# Patient Record
Sex: Female | Born: 1985 | Race: White | Hispanic: No | Marital: Married | Smoking: Never smoker
Health system: Southern US, Community
[De-identification: ages and names within clinical notes are randomized; demographics above are authoritative.]

## PROBLEM LIST (undated history)

## (undated) ENCOUNTER — Inpatient Hospital Stay (HOSPITAL_COMMUNITY): Payer: Self-pay

## (undated) DIAGNOSIS — K219 Gastro-esophageal reflux disease without esophagitis: Secondary | ICD-10-CM

## (undated) DIAGNOSIS — R6252 Short stature (child): Secondary | ICD-10-CM

## (undated) DIAGNOSIS — E079 Disorder of thyroid, unspecified: Secondary | ICD-10-CM

## (undated) DIAGNOSIS — R6250 Unspecified lack of expected normal physiological development in childhood: Secondary | ICD-10-CM

## (undated) DIAGNOSIS — I10 Essential (primary) hypertension: Secondary | ICD-10-CM

## (undated) DIAGNOSIS — R51 Headache: Secondary | ICD-10-CM

## (undated) DIAGNOSIS — R519 Headache, unspecified: Secondary | ICD-10-CM

## (undated) DIAGNOSIS — M549 Dorsalgia, unspecified: Secondary | ICD-10-CM

## (undated) HISTORY — DX: Gastro-esophageal reflux disease without esophagitis: K21.9

## (undated) HISTORY — PX: MOUTH SURGERY: SHX715

## (undated) HISTORY — DX: Disorder of thyroid, unspecified: E07.9

---

## 2014-12-11 ENCOUNTER — Encounter (HOSPITAL_BASED_OUTPATIENT_CLINIC_OR_DEPARTMENT_OTHER): Payer: Self-pay | Admitting: Emergency Medicine

## 2014-12-11 ENCOUNTER — Emergency Department (HOSPITAL_BASED_OUTPATIENT_CLINIC_OR_DEPARTMENT_OTHER)
Admission: EM | Admit: 2014-12-11 | Discharge: 2014-12-11 | Disposition: A | Discharge status: Home / Self Care | Payer: Self-pay | Attending: Emergency Medicine | Admitting: Emergency Medicine

## 2014-12-11 DIAGNOSIS — M542 Cervicalgia: Secondary | ICD-10-CM | POA: Insufficient documentation

## 2014-12-11 DIAGNOSIS — Z3202 Encounter for pregnancy test, result negative: Secondary | ICD-10-CM | POA: Insufficient documentation

## 2014-12-11 DIAGNOSIS — J45909 Unspecified asthma, uncomplicated: Secondary | ICD-10-CM | POA: Insufficient documentation

## 2014-12-11 DIAGNOSIS — Z87828 Personal history of other (healed) physical injury and trauma: Secondary | ICD-10-CM | POA: Insufficient documentation

## 2014-12-11 DIAGNOSIS — Z7951 Long term (current) use of inhaled steroids: Secondary | ICD-10-CM | POA: Insufficient documentation

## 2014-12-11 HISTORY — DX: Dorsalgia, unspecified: M54.9

## 2014-12-11 LAB — PREGNANCY, URINE: PREG TEST UR: NEGATIVE

## 2014-12-11 MED ORDER — TRAMADOL HCL 50 MG PO TABS: 50.0000 mg | ORAL_TABLET | Freq: Four times a day (QID) | ORAL | Status: DC | PRN

## 2014-12-11 MED ORDER — TRAMADOL HCL 50 MG PO TABS
50.0000 mg | ORAL_TABLET | Freq: Once | ORAL | Status: AC
  Administered 2014-12-11: 50 mg via ORAL
  Filled 2014-12-11: qty 1
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
## 2014-12-11 NOTE — Discharge Instructions (Signed)
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
## 2015-05-16 LAB — OB RESULTS CONSOLE ABO/RH: RH Type: POSITIVE

## 2015-05-16 LAB — OB RESULTS CONSOLE HEPATITIS B SURFACE ANTIGEN: Hepatitis B Surface Ag: NEGATIVE

## 2015-05-16 LAB — OB RESULTS CONSOLE ANTIBODY SCREEN: Antibody Screen: NEGATIVE

## 2015-05-16 LAB — OB RESULTS CONSOLE RPR: RPR: NONREACTIVE

## 2015-05-16 LAB — OB RESULTS CONSOLE RUBELLA ANTIBODY, IGM: RUBELLA: IMMUNE

## 2015-05-16 LAB — OB RESULTS CONSOLE HIV ANTIBODY (ROUTINE TESTING): HIV: NONREACTIVE

## 2015-05-23 LAB — OB RESULTS CONSOLE GC/CHLAMYDIA
CHLAMYDIA, DNA PROBE: NEGATIVE
GC PROBE AMP, GENITAL: NEGATIVE

## 2015-07-22 NOTE — L&D Delivery Note (Signed)
Operative Delivery Note At 6:58 PM a viable and healthy female was delivered via Vaginal, Vacuum Investment banker, operational(Extractor).  Presentation: vertex; Position: Right,, Occiput,, Anterior; Station: +4.  Verbal consent: obtained from family.  Risks and benefits discussed in detail.  Risks include, but are not limited to the risks of anesthesia, bleeding, infection, damage to maternal tissues, fetal cephalhematoma.  There is also the risk of inability to effect vaginal delivery of the head, or shoulder dystocia that cannot be resolved by established maneuvers, leading to the need for emergency cesarean section.  APGAR: 6, 8; weight  .   Placenta status: Intact, Spontaneous.   Cord: 3 vessels with the following complications: None.  Cord pH: pending  Anesthesia: Epidural  Instruments: kiwi x 2 pulls , no pop offs Episiotomy: None Lacerations: 2nd degree Suture Repair: 2.0 vicryl rapide Est. Blood Loss (mL): 100  Mom to postpartum.  Baby to Couplet care / Skin to Skin.  Arrayah Connors J 12/11/2015, 7:22 PM

## 2015-11-22 LAB — OB RESULTS CONSOLE GBS: STREP GROUP B AG: NEGATIVE

## 2015-11-28 ENCOUNTER — Other Ambulatory Visit: Payer: Self-pay | Admitting: Obstetrics and Gynecology

## 2015-12-04 ENCOUNTER — Telehealth (HOSPITAL_COMMUNITY): Payer: Self-pay | Admitting: *Deleted

## 2015-12-04 ENCOUNTER — Encounter (HOSPITAL_COMMUNITY): Payer: Self-pay | Admitting: *Deleted

## 2015-12-04 NOTE — Telephone Encounter (Signed)
Preadmission screen  

## 2015-12-11 ENCOUNTER — Other Ambulatory Visit: Payer: Self-pay | Admitting: Obstetrics and Gynecology

## 2015-12-11 ENCOUNTER — Inpatient Hospital Stay (HOSPITAL_COMMUNITY): Payer: Managed Care, Other (non HMO) | Admitting: Anesthesiology

## 2015-12-11 ENCOUNTER — Encounter (HOSPITAL_COMMUNITY): Payer: Self-pay

## 2015-12-11 ENCOUNTER — Inpatient Hospital Stay (HOSPITAL_COMMUNITY)
Admission: RE | Admit: 2015-12-11 | Discharge: 2015-12-13 | DRG: 775 | Disposition: A | Discharge status: Home / Self Care | Payer: Managed Care, Other (non HMO) | Source: Ambulatory Visit | Attending: Obstetrics and Gynecology | Admitting: Obstetrics and Gynecology

## 2015-12-11 DIAGNOSIS — O365930 Maternal care for other known or suspected poor fetal growth, third trimester, not applicable or unspecified: Principal | ICD-10-CM | POA: Diagnosis present

## 2015-12-11 DIAGNOSIS — Z6841 Body Mass Index (BMI) 40.0 and over, adult: Secondary | ICD-10-CM | POA: Diagnosis not present

## 2015-12-11 DIAGNOSIS — O4420 Partial placenta previa NOS or without hemorrhage, unspecified trimester: Secondary | ICD-10-CM

## 2015-12-11 DIAGNOSIS — Z8759 Personal history of other complications of pregnancy, childbirth and the puerperium: Secondary | ICD-10-CM

## 2015-12-11 DIAGNOSIS — O365990 Maternal care for other known or suspected poor fetal growth, unspecified trimester, not applicable or unspecified: Secondary | ICD-10-CM | POA: Diagnosis present

## 2015-12-11 DIAGNOSIS — Z3A39 39 weeks gestation of pregnancy: Secondary | ICD-10-CM

## 2015-12-11 DIAGNOSIS — O99214 Obesity complicating childbirth: Secondary | ICD-10-CM | POA: Diagnosis present

## 2015-12-11 DIAGNOSIS — IMO0002 Reserved for concepts with insufficient information to code with codable children: Secondary | ICD-10-CM | POA: Diagnosis present

## 2015-12-11 LAB — CBC
HCT: 41.1 % (ref 36.0–46.0)
Hemoglobin: 13.6 g/dL (ref 12.0–15.0)
MCH: 28.5 pg (ref 26.0–34.0)
MCHC: 33.1 g/dL (ref 30.0–36.0)
MCV: 86.2 fL (ref 78.0–100.0)
PLATELETS: 229 10*3/uL (ref 150–400)
RBC: 4.77 MIL/uL (ref 3.87–5.11)
RDW: 15.7 % — AB (ref 11.5–15.5)
WBC: 11.2 10*3/uL — AB (ref 4.0–10.5)

## 2015-12-11 LAB — TYPE AND SCREEN
ABO/RH(D): O POS
Antibody Screen: NEGATIVE

## 2015-12-11 LAB — COMPREHENSIVE METABOLIC PANEL
ALT: 15 U/L (ref 14–54)
AST: 22 U/L (ref 15–41)
Albumin: 2.9 g/dL — ABNORMAL LOW (ref 3.5–5.0)
Alkaline Phosphatase: 143 U/L — ABNORMAL HIGH (ref 38–126)
Anion gap: 13 (ref 5–15)
BUN: 13 mg/dL (ref 6–20)
CHLORIDE: 103 mmol/L (ref 101–111)
CO2: 19 mmol/L — ABNORMAL LOW (ref 22–32)
Calcium: 8.8 mg/dL — ABNORMAL LOW (ref 8.9–10.3)
Creatinine, Ser: 0.67 mg/dL (ref 0.44–1.00)
Glucose, Bld: 120 mg/dL — ABNORMAL HIGH (ref 65–99)
POTASSIUM: 4.1 mmol/L (ref 3.5–5.1)
Sodium: 135 mmol/L (ref 135–145)
Total Bilirubin: 0.4 mg/dL (ref 0.3–1.2)
Total Protein: 6.3 g/dL — ABNORMAL LOW (ref 6.5–8.1)

## 2015-12-11 LAB — RPR: RPR: NONREACTIVE

## 2015-12-11 LAB — ABO/RH: ABO/RH(D): O POS

## 2015-12-11 MED ORDER — LACTATED RINGERS IV SOLN
INTRAVENOUS | Status: DC
  Administered 2015-12-11: 125 mL/h via INTRAUTERINE

## 2015-12-11 MED ORDER — BENZOCAINE-MENTHOL 20-0.5 % EX AERO
1.0000 "application " | INHALATION_SPRAY | CUTANEOUS | Status: DC | PRN
  Administered 2015-12-11: 1 via TOPICAL
  Filled 2015-12-11: qty 56

## 2015-12-11 MED ORDER — METHYLERGONOVINE MALEATE 0.2 MG/ML IJ SOLN: 0.2000 mg | INTRAMUSCULAR | Status: DC | PRN

## 2015-12-11 MED ORDER — OXYCODONE-ACETAMINOPHEN 5-325 MG PO TABS: 2.0000 | ORAL_TABLET | ORAL | Status: DC | PRN

## 2015-12-11 MED ORDER — FLEET ENEMA 7-19 GM/118ML RE ENEM: 1.0000 | ENEMA | RECTAL | Status: DC | PRN

## 2015-12-11 MED ORDER — ACETAMINOPHEN 325 MG PO TABS: 650.0000 mg | ORAL_TABLET | ORAL | Status: DC | PRN

## 2015-12-11 MED ORDER — ZOLPIDEM TARTRATE 5 MG PO TABS: 5.0000 mg | ORAL_TABLET | Freq: Every evening | ORAL | Status: DC | PRN

## 2015-12-11 MED ORDER — PRENATAL MULTIVITAMIN CH
1.0000 | ORAL_TABLET | Freq: Every day | ORAL | Status: DC
  Administered 2015-12-12 – 2015-12-13 (×2): 1 via ORAL
  Filled 2015-12-11 (×2): qty 1

## 2015-12-11 MED ORDER — METHYLERGONOVINE MALEATE 0.2 MG PO TABS: 0.2000 mg | ORAL_TABLET | ORAL | Status: DC | PRN

## 2015-12-11 MED ORDER — DIBUCAINE 1 % RE OINT: 1.0000 "application " | TOPICAL_OINTMENT | RECTAL | Status: DC | PRN

## 2015-12-11 MED ORDER — TETANUS-DIPHTH-ACELL PERTUSSIS 5-2.5-18.5 LF-MCG/0.5 IM SUSP: 0.5000 mL | Freq: Once | INTRAMUSCULAR | Status: DC

## 2015-12-11 MED ORDER — SIMETHICONE 80 MG PO CHEW: 80.0000 mg | CHEWABLE_TABLET | ORAL | Status: DC | PRN

## 2015-12-11 MED ORDER — FENTANYL CITRATE (PF) 100 MCG/2ML IJ SOLN: 50.0000 ug | INTRAMUSCULAR | Status: DC | PRN

## 2015-12-11 MED ORDER — OXYTOCIN BOLUS FROM INFUSION
500.0000 mL | INTRAVENOUS | Status: DC
  Administered 2015-12-11: 500 mL via INTRAVENOUS

## 2015-12-11 MED ORDER — SENNOSIDES-DOCUSATE SODIUM 8.6-50 MG PO TABS
2.0000 | ORAL_TABLET | ORAL | Status: DC
  Administered 2015-12-12 (×2): 2 via ORAL
  Filled 2015-12-11 (×2): qty 2

## 2015-12-11 MED ORDER — ONDANSETRON HCL 4 MG PO TABS: 4.0000 mg | ORAL_TABLET | ORAL | Status: DC | PRN

## 2015-12-11 MED ORDER — EPHEDRINE 5 MG/ML INJ
10.0000 mg | INTRAVENOUS | Status: DC | PRN
  Filled 2015-12-11: qty 2

## 2015-12-11 MED ORDER — LIDOCAINE HCL (PF) 1 % IJ SOLN
INTRAMUSCULAR | Status: DC | PRN
  Administered 2015-12-11: 8 mL via EPIDURAL

## 2015-12-11 MED ORDER — TERBUTALINE SULFATE 1 MG/ML IJ SOLN
0.2500 mg | Freq: Once | INTRAMUSCULAR | Status: DC | PRN
  Filled 2015-12-11: qty 1

## 2015-12-11 MED ORDER — OXYCODONE-ACETAMINOPHEN 5-325 MG PO TABS: 1.0000 | ORAL_TABLET | ORAL | Status: DC | PRN

## 2015-12-11 MED ORDER — ONDANSETRON HCL 4 MG/2ML IJ SOLN: 4.0000 mg | INTRAMUSCULAR | Status: DC | PRN

## 2015-12-11 MED ORDER — LACTATED RINGERS IV SOLN: 500.0000 mL | Freq: Once | INTRAVENOUS | Status: DC

## 2015-12-11 MED ORDER — FENTANYL 2.5 MCG/ML BUPIVACAINE 1/10 % EPIDURAL INFUSION (WH - ANES)
14.0000 mL/h | INTRAMUSCULAR | Status: DC | PRN
  Administered 2015-12-11 (×2): 14 mL/h via EPIDURAL
  Filled 2015-12-11 (×2): qty 125

## 2015-12-11 MED ORDER — LACTATED RINGERS IV SOLN
INTRAVENOUS | Status: DC
  Administered 2015-12-11: 125 mL/h via INTRAVENOUS
  Administered 2015-12-11: 500 mL via INTRAVENOUS
  Administered 2015-12-11 (×2): 125 mL/h via INTRAVENOUS

## 2015-12-11 MED ORDER — CITRIC ACID-SODIUM CITRATE 334-500 MG/5ML PO SOLN: 30.0000 mL | ORAL | Status: DC | PRN

## 2015-12-11 MED ORDER — PHENYLEPHRINE 40 MCG/ML (10ML) SYRINGE FOR IV PUSH (FOR BLOOD PRESSURE SUPPORT)
80.0000 ug | PREFILLED_SYRINGE | INTRAVENOUS | Status: DC | PRN
  Filled 2015-12-11: qty 5

## 2015-12-11 MED ORDER — PHENYLEPHRINE 40 MCG/ML (10ML) SYRINGE FOR IV PUSH (FOR BLOOD PRESSURE SUPPORT)
80.0000 ug | PREFILLED_SYRINGE | INTRAVENOUS | Status: DC | PRN
  Filled 2015-12-11: qty 5
  Filled 2015-12-11: qty 10

## 2015-12-11 MED ORDER — OXYTOCIN 40 UNITS IN LACTATED RINGERS INFUSION - SIMPLE MED
1.0000 m[IU]/min | INTRAVENOUS | Status: DC
  Administered 2015-12-11: 2 m[IU]/min via INTRAVENOUS

## 2015-12-11 MED ORDER — DIPHENHYDRAMINE HCL 50 MG/ML IJ SOLN: 12.5000 mg | INTRAMUSCULAR | Status: DC | PRN

## 2015-12-11 MED ORDER — LIDOCAINE HCL (PF) 1 % IJ SOLN
30.0000 mL | INTRAMUSCULAR | Status: DC | PRN
  Filled 2015-12-11: qty 30

## 2015-12-11 MED ORDER — VITAMIN K1 1 MG/0.5ML IJ SOLN
INTRAMUSCULAR | Status: AC
  Filled 2015-12-11: qty 0.5

## 2015-12-11 MED ORDER — MISOPROSTOL 25 MCG QUARTER TABLET
25.0000 ug | ORAL_TABLET | ORAL | Status: DC | PRN
  Filled 2015-12-11: qty 1

## 2015-12-11 MED ORDER — WITCH HAZEL-GLYCERIN EX PADS: 1.0000 "application " | MEDICATED_PAD | CUTANEOUS | Status: DC | PRN

## 2015-12-11 MED ORDER — LACTATED RINGERS IV SOLN
500.0000 mL | INTRAVENOUS | Status: DC | PRN
  Administered 2015-12-11 (×2): 500 mL via INTRAVENOUS

## 2015-12-11 MED ORDER — ACETAMINOPHEN 325 MG PO TABS
650.0000 mg | ORAL_TABLET | ORAL | Status: DC | PRN
  Administered 2015-12-12 – 2015-12-13 (×2): 650 mg via ORAL
  Filled 2015-12-11 (×2): qty 2

## 2015-12-11 MED ORDER — OXYTOCIN 40 UNITS IN LACTATED RINGERS INFUSION - SIMPLE MED
2.5000 [IU]/h | INTRAVENOUS | Status: DC
  Filled 2015-12-11: qty 1000

## 2015-12-11 MED ORDER — ONDANSETRON HCL 4 MG/2ML IJ SOLN
4.0000 mg | Freq: Four times a day (QID) | INTRAMUSCULAR | Status: DC | PRN
  Administered 2015-12-11: 4 mg via INTRAVENOUS
  Filled 2015-12-11: qty 2

## 2015-12-11 MED ORDER — IBUPROFEN 600 MG PO TABS
600.0000 mg | ORAL_TABLET | Freq: Four times a day (QID) | ORAL | Status: DC
  Administered 2015-12-12 – 2015-12-13 (×7): 600 mg via ORAL
  Filled 2015-12-11 (×7): qty 1

## 2015-12-11 MED ORDER — DIPHENHYDRAMINE HCL 25 MG PO CAPS: 25.0000 mg | ORAL_CAPSULE | Freq: Four times a day (QID) | ORAL | Status: DC | PRN

## 2015-12-11 MED ORDER — COCONUT OIL OIL: 1.0000 "application " | TOPICAL_OIL | Status: DC | PRN

## 2015-12-11 NOTE — Progress Notes (Addendum)
Pt care given to Orene Desanctishi-Chi O. RN [facility] given to [place], [place]

## 2015-12-11 NOTE — Progress Notes (Signed)
Jessica Hooper is a 30 y.o. G1P0 at 2476w6d by LMP admitted for induction of labor due to Poor fetal growth.  Subjective: Comfortable after epidural  Objective: BP 135/95 mmHg  Pulse 83  Temp(Src) 98.3 F (36.8 C) (Oral)  Resp 16  Ht 5\' 1"  (1.549 m)  Wt 102.513 kg (226 lb)  BMI 42.72 kg/m2  SpO2 100%  LMP 03/15/2015      FHT:  FHR: 155 bpm, variability: moderate,  accelerations:  Present,  decelerations:  Absent UC:   regular, every 2-3 minutes SVE:   Dilation: 4.5 Effacement (%): 80 Station: -1, 0 Exam by:: MD Fredrika Canby  AROM- clear FSE and IUPC placed without difficulty  Labs: Lab Results  Component Value Date   WBC 11.2* 12/11/2015   HGB 13.6 12/11/2015   HCT 41.1 12/11/2015   MCV 86.2 12/11/2015   PLT 229 12/11/2015    Assessment / Plan: Induction of labor due to IUGR,  progressing well on pitocin  Labor: Progressing normally Preeclampsia:  no signs or symptoms of toxicity Fetal Wellbeing:  Category I Pain Control:  Epidural I/D:  n/a Anticipated MOD:  NSVD  Jessica Hooper 12/11/2015, 12:30 PM

## 2015-12-11 NOTE — Anesthesia Preprocedure Evaluation (Signed)
Anesthesia Evaluation  Patient identified by MRN, date of birth, ID band Patient awake    Reviewed: Allergy & Precautions, H&P , NPO status , Patient's Chart, lab work & pertinent test results  Airway Mallampati: II  TM Distance: >3 FB Neck ROM: full    Dental no notable dental hx.    Pulmonary    Pulmonary exam normal        Cardiovascular negative cardio ROS Normal cardiovascular exam     Neuro/Psych negative neurological ROS  negative psych ROS   GI/Hepatic Neg liver ROS,   Endo/Other  Morbid obesity  Renal/GU negative Renal ROS     Musculoskeletal   Abdominal (+) + obese,   Peds  Hematology negative hematology ROS (+)   Anesthesia Other Findings   Reproductive/Obstetrics (+) Pregnancy                             Anesthesia Physical Anesthesia Plan  ASA: III  Anesthesia Plan: Epidural   Post-op Pain Management:    Induction:   Airway Management Planned:   Additional Equipment:   Intra-op Plan:   Post-operative Plan:   Informed Consent: I have reviewed the patients History and Physical, chart, labs and discussed the procedure including the risks, benefits and alternatives for the proposed anesthesia with the patient or authorized representative who has indicated his/her understanding and acceptance.     Plan Discussed with:   Anesthesia Plan Comments:         Anesthesia Quick Evaluation

## 2015-12-11 NOTE — Progress Notes (Signed)
Jessica Jessica is a 30 y.o. G1P0 at 2544w6d by LMP admitted for induction of labor due to Poor fetal growth.  Subjective: Comfortable  Objective: BP 147/89 mmHg  Pulse 72  Temp(Src) 98.1 F (36.7 C) (Oral)  Resp 16  Ht 5\' 1"  (1.549 m)  Wt 102.513 kg (226 lb)  BMI 42.72 kg/m2  SpO2 100%  LMP 03/15/2015   Total I/O In: -  Out: 750 [Urine:750]  FHT:  FHR: 130s bpm, variability: moderate,  accelerations:  Present,  decelerations:  Present occ variable , occ late, non repetitive UC:   irregular, every 1-4 minutes SVE:   Dilation: 6 Effacement (%): 90 Station: -1 Exam by:: MD Billy Coastaavon /OT MVU 200-300 Cervix non edematous  Labs: Lab Results  Component Value Date   WBC 11.2* 12/11/2015   HGB 13.6 12/11/2015   HCT 41.1 12/11/2015   MCV 86.2 12/11/2015   PLT 229 12/11/2015    Assessment / Plan: Induction of labor due to IUGR,  progressing well on pitocin Occ Category 2 tracing, good scalp stimulation  Labor: Minimal process, OT  Preeclampsia:  no signs or symptoms of toxicity Fetal Wellbeing:  Category I and Category II Pain Control:  Epidural I/D:  n/a Anticipated MOD:  guarded  Repositon exaggerated sims, Amnioinfuse if variables recurrent Reassess one hr  Jessica Jessica 12/11/2015, 5:05 PM

## 2015-12-11 NOTE — Anesthesia Procedure Notes (Signed)
Epidural Patient location during procedure: OB Start time: 12/11/2015 12:07 PM End time: 12/11/2015 12:11 PM  Staffing Anesthesiologist: Leilani AbleHATCHETT, Issaih Kaus  Preanesthetic Checklist Completed: patient identified, surgical consent, pre-op evaluation, timeout performed, IV checked, risks and benefits discussed and monitors and equipment checked  Epidural Patient position: sitting Prep: site prepped and draped and DuraPrep Patient monitoring: continuous pulse ox and blood pressure Approach: midline Location: L3-L4 Injection technique: LOR air  Needle:  Needle type: Tuohy  Needle gauge: 17 G Needle length: 9 cm and 9 Needle insertion depth: 6 cm Catheter type: closed end flexible Catheter size: 19 Gauge Catheter at skin depth: 11 cm Test dose: negative and Other  Assessment Sensory level: T9 Events: blood not aspirated, injection not painful, no injection resistance, negative IV test and no paresthesia  Additional Notes Reason for block:procedure for pain

## 2015-12-11 NOTE — Progress Notes (Signed)
Jessica Hooper is a 30 y.o. G1P0 at 817w6d by LMP admitted for induction of labor due to Poor fetal growth.  Subjective: Occ pressure  Objective: BP 139/79 mmHg  Pulse 70  Temp(Src) 98.1 F (36.7 C) (Oral)  Resp 16  Ht 5\' 1"  (1.549 m)  Wt 102.513 kg (226 lb)  BMI 42.72 kg/m2  SpO2 100%  LMP 03/15/2015      FHT:  FHR: 150 bpm, variability: moderate,  accelerations:  Present,  decelerations:  Present non repetitive variables and occ late UC:   regular, every 2-3 minutes SVE:   Dilation: 5.5 Effacement (%): 80, 90 Station: -1, 0 Exam by:: MD Orton Capell MVU > 200  Labs: Lab Results  Component Value Date   WBC 11.2* 12/11/2015   HGB 13.6 12/11/2015   HCT 41.1 12/11/2015   MCV 86.2 12/11/2015   PLT 229 12/11/2015    Assessment / Plan: Induction of labor due to IUGR,  progressing well on pitocin  Labor: Progressing normally Preeclampsia:  no signs or symptoms of toxicity Fetal Wellbeing:  Category I Pain Control:  Epidural I/D:  n/a Anticipated MOD:  Guarded approach to VD  Khup Sapia J 12/11/2015, 2:24 PM

## 2015-12-11 NOTE — Anesthesia Pain Management Evaluation Note (Signed)
  CRNA Pain Management Visit Note  Patient: Jessica Hooper, 30 y.o., female  "Hello [facility] am a [facility]. We have an anesthesia team available at all times to provide care throughout the hospital, including epidural management and anesthesia for C-section. I don't know your plan for the delivery whether it a natural birth, water birth, IV sedation, nitrous supplementation, doula or epidural, but we want to meet your pain goals."   1.Was your pain managed to your expectations on prior hospitalizations?   No prior hospitalizations  2.What is your expectation for pain management during this hospitalization?     Epidural  3.How can we help you reach that goal? Epidural  Record the patient's initial score and the patient's pain goal.   Pain: 0  Pain Goal: 6 [facility] wants you to be able to say your pain was always managed very well.  Cephus ShellingBURGER,Bensen Chadderdon 12/11/2015
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
## 2015-12-12 ENCOUNTER — Inpatient Hospital Stay (HOSPITAL_COMMUNITY): Admission: RE | Admit: 2015-12-12 | Payer: No Typology Code available for payment source | Source: Ambulatory Visit

## 2015-12-12 ENCOUNTER — Encounter (HOSPITAL_COMMUNITY): Payer: Self-pay

## 2015-12-12 DIAGNOSIS — Z8759 Personal history of other complications of pregnancy, childbirth and the puerperium: Secondary | ICD-10-CM

## 2015-12-12 LAB — CBC
HEMATOCRIT: 35.6 % — AB (ref 36.0–46.0)
Hemoglobin: 11.8 g/dL — ABNORMAL LOW (ref 12.0–15.0)
MCH: 28.5 pg (ref 26.0–34.0)
MCHC: 33.1 g/dL (ref 30.0–36.0)
MCV: 86 fL (ref 78.0–100.0)
PLATELETS: 207 10*3/uL (ref 150–400)
RBC: 4.14 MIL/uL (ref 3.87–5.11)
RDW: 15.9 % — AB (ref 11.5–15.5)
WBC: 14.8 10*3/uL — AB (ref 4.0–10.5)

## 2015-12-12 MED ORDER — OXYCODONE-ACETAMINOPHEN 5-325 MG PO TABS
1.0000 | ORAL_TABLET | ORAL | Status: DC | PRN
  Administered 2015-12-12 (×2): 1 via ORAL
  Administered 2015-12-12: 2 via ORAL
  Administered 2015-12-13: 1 via ORAL
  Filled 2015-12-12 (×2): qty 1
  Filled 2015-12-12: qty 2

## 2015-12-12 NOTE — Lactation Note (Signed)
This note was copied from a baby's chart. Lactation Consultation Note  Mom just finished giving baby bottle of formula. Encouraged to pump now to promote milk supply. Encouraged to page for assist at next feeding. No questions at present.  Patient Name: Jessica Hooper VHQIO'NToday's Date: 12/12/2015     Maternal Data    Feeding    LATCH Score/Interventions                      Lactation Tools Discussed/Used     Consult Status      Pamelia HoitWeeks, [name] 12/12/2015, 10:49 AM

## 2015-12-12 NOTE — Progress Notes (Signed)
Patient ID: Jessica Hooper, female   DOB: 1985-10-28, 30 y.o.   MRN: [id number] PPD # 1  Subjective: Pt reports feeling "ok" / Pain controlled with ibuprofen Tolerating po/ Voiding without problems/ No n/v Bleeding is moderate Newborn info:  Information for the patient's newborn:  [place], Boy Marchelle Folksmanda [[id number]][[id number]]  female  / circ to be performed today by Dr Billy Coastaavon / Feeding: breast   Objective:  VS: Blood pressure 129/88, pulse 79, temperature 97.8 F (36.6 C), temperature source Oral, resp. rate 18.    Recent Labs  12/11/15 0750 12/12/15 0505  WBC 11.2* 14.8*  HGB 13.6 11.8*  HCT 41.1 35.6*  PLT 229 207    Blood type: O POS Rubella: Immune    Physical Exam:  General: alert, cooperative and no distress CV: Regular rate and rhythm Resp: clear Abdomen: soft, nontender, normal bowel sounds Uterine Fundus: firm, below umbilicus, nontender Perineum: healing with good reapproximation Lochia: moderate Ext: edema trace and Homans sign is negative, no sign of DVT   A/P: PPD # 1/ G1P1001/ S/P: VAVD w/ 2nd deg lac Doing well Continue routine post partum orders Anticipate D/C home in AM    Demetrius RevelFISHER,Eiza Canniff K, MSN, [facility] 12/12/2015, 10:14 AM

## 2015-12-12 NOTE — Lactation Note (Signed)
This note was copied from a baby's chart. Lactation Consultation Note New mom has flat nipples on large breast. Hand expression w/a dot of thick colostrum. Hand expression taught to Mom. RN gave NS for baby to latch on. Mom has shells on and wears between feedings provided by RN. Mom stated she was able to latch the baby for the last feeding after wearing the shells. Mom denied painful latch. When compressing the areola, the nipple compresses in and areola dimples in as if edema. Reverse pressure helpful. Rolled nipple and was able to pull out some. LC not sure if baby could obtain deep latch for long. RN set up DEBP w/teaching. LPI information sheet given for feeding information. Mom encouraged to feed baby 8-12 times/24 hours and with feeding cues. educated about newborn behavior.Ecouraged to call for assistance if needed and to verify proper latch.Referred to Baby and Me Book in Breastfeeding section Pg. 22-23 for position options and Proper latch demonstration.WH/LC brochure given w/resources, support groups and LC services. Encouraged STS, I&O, supply and demand. Discussed supplementation after BF. Mom ok w/giving formula for supplementing.  Patient Name: Jessica Hooper'UToday's Date: 12/12/2015 Reason for consult: Initial assessment;Difficult latch;Infant < 6lbs   Maternal Data Has patient been taught Hand Expression?: Yes Does the patient have breastfeeding experience prior to this delivery?: No  Feeding Feeding Type: Formula  LATCH Score/Interventions Intervention(s): Skin to skin;Teach feeding cues;Waking techniques  Intervention(s): Hand expression  Type of Nipple: Flat Intervention(s): Shells;Double electric pump  Comfort (Breast/Nipple): Soft / non-tender     Intervention(s): Breastfeeding basics reviewed;Support Pillows;Position options;Skin to skin     Lactation Tools Discussed/Used Pump Review: Setup, frequency, and cleaning;Milk Storage Initiated by:: RN Date  initiated:: 12/12/15   Consult Status Consult Status: Follow-up Date: 12/12/15 Follow-up type: In-patient    Charyl DancerCARVER, Jaiden Wahab G 12/12/2015, 4:41 AM

## 2015-12-12 NOTE — Anesthesia Postprocedure Evaluation (Signed)
Anesthesia Post Note  Patient: Jessica Hooper  Procedure(s) Performed: * No procedures listed *  Patient location during evaluation: Mother Baby Anesthesia Type: Epidural Level of consciousness: awake and alert Pain management: pain level controlled Vital Signs Assessment: post-procedure vital signs reviewed and stable Respiratory status: spontaneous breathing, nonlabored ventilation and respiratory function stable Cardiovascular status: stable Postop Assessment: no headache, no backache and epidural receding Anesthetic complications: no     Last Vitals:  Filed Vitals:   12/12/15 0259 12/12/15 0604  BP: 126/80 129/88  Pulse: 90 79  Temp:  36.6 C  Resp:  18    Last Pain:  Filed Vitals:   12/12/15 0638  PainSc: 1    Pain Goal:                 [facility] EnglandMARSHALL,Lexxi Koslow

## 2015-12-13 ENCOUNTER — Encounter (HOSPITAL_COMMUNITY): Payer: Self-pay

## 2015-12-13 MED ORDER — OXYCODONE-ACETAMINOPHEN 5-325 MG PO TABS: 1.0000 | ORAL_TABLET | ORAL | Status: DC | PRN

## 2015-12-13 NOTE — Lactation Note (Addendum)
This note was copied from a baby's chart. Lactation Consultation Note: Mother states that infant is not latching well at this time. She states that she is pumping after attempting to breastfeed. Mother is supplementing with formula.  Reviewed supplemental guidelines. Mother has a hard copy of guidelines.  Reviewed treatment to prevent severe engorgement. High lighted information in baby and me book. Mother was offered a follow up visit with Lactation consultant. Mother reluctant and declined visit. She state," I will wait until I  gets home and see how things go". Mother is aware of BFSG's and community services to assist with breastfeeding.   Patient Name: [name] ZOXWR'UToday's Date: 12/13/2015 Reason for consult: Follow-up assessment   Maternal Data    Feeding Feeding Type: Breast Fed Length of feed: 12 min  LATCH Score/Interventions                      Lactation Tools Discussed/Used     Consult Status      Jessica Hooper, Jessica Hooper 12/13/2015, 10:59 AM

## 2015-12-13 NOTE — Progress Notes (Signed)
Patient ID: Jessica Hooper, female   DOB: 1985/11/25, 30 y.o.   MRN: [id number] PPD # 2  Subjective: Pt reports feeling sore, and eager for d/c home / Pain controlled with ibuprofen and percocet Tolerating po/ Voiding without problems/ No n/v Bleeding is moderate/ Newborn info:  Information for the patient's newborn:  [place], Boy Marchelle Folksmanda [[id number]][[id number]]  female  / circ performed yesterday / Feeding: breast    Objective:  VS: Blood pressure 142/79, pulse 87, temperature 98 F (36.7 C), temperature source Oral, resp. rate 87.    Recent Labs  12/11/15 0750 12/12/15 0505  WBC 11.2* 14.8*  HGB 13.6 11.8*  HCT 41.1 35.6*  PLT 229 207    Blood type: O POS Rubella: Immune    Physical Exam:  General:  alert, cooperative and no distress CV: Regular rate and rhythm Resp: clear Abdomen: soft, nontender, normal bowel sounds Uterine Fundus: firm, below umbilicus, nontender Perineum: healing with good reapproximation and mod ecchymosis Lochia: moderate Ext: edema +1 pedal and pretib and Homans sign is negative, no sign of DVT    A/P: PPD # 2/ G1P1001/ S/P:VAVD w/ 2nd deg lac Doing well and stable for discharge home RX: Percocet 5/325 1 to 2 po Q 4 hrs prn pain #12 No refill WOB/GYN booklet given Routine pp visit in 6wks   Demetrius RevelFISHER,Daved Mcfann K, MSN, [facility] 12/13/2015, 8:25 AM

## 2015-12-13 NOTE — Discharge Summary (Signed)
OB Discharge Summary  Patient Name: Jessica Hooper DOB: 11/14/1985 MRN: [id number]  Date of admission: 12/11/2015  Pt is a G1P1001 at 3065w6d. Admitting diagnosis: INDUCTION Secondary diagnosis: IUGR   Discharge diagnosis: Term Pregnancy Delivered and VAVD w/ 2nd deg lac   Date of discharge: 12/13/2015        Prenatal history: G1P1001   EDC : 12/19/2015, by Other Basis  [facility] at [facility]  Primary provider : Dr Jessica Hooper Prenatal course complicated by Obesity and IUGR  Prenatal Labs: ABO, Rh: --/--/O POS, O POS (05/23 0750) Antibody: NEG (05/23 0750) Rubella: Immune (10/26 0000) RPR: Non Reactive (05/23 0750)  HBsAg: Negative (10/26 0000)  HIV: Non-reactive (10/26 0000)  GBS: Negative (05/04 0000)                                    Hospital course:  Induction of Labor With Vaginal Delivery   30 y.o. yo G1P1001 at 4665w6d was admitted to the hospital 12/11/2015 for induction of labor.  Indication for induction: IUGR.  Patient had an uncomplicated labor course as follows: Membrane Rupture Time/Date: 12:24 PM ,12/11/2015   Intrapartum Procedures: Episiotomy: None [1]                                         Lacerations:  2nd degree [3]  Patient had delivery of a Viable infant.  Information for the patient's newborn:  Jessica Hooper, [name] [[id number]][[id number]]  Delivery Method: Vaginal, Vacuum (Extractor) (Filed from Delivery Summary)   12/11/2015  Details of delivery can be found in separate delivery note.  Patient had a routine postpartum course. Patient is discharged home 12/13/2015.  Augmentation: Pitocin Delivering PROVIDER: Olivia MackieAAVON, Hooper                                                            Complications: None  Newborn Data: Live born female  Birth Weight: 5 lb 2.9 oz (2350 g) APGAR: 6, 8  Baby Feeding: Breast Disposition:home with mother  Post partum procedures:none    Labs: Lab Results  Component Value Date   WBC 14.8* 12/12/2015   HGB 11.8* 12/12/2015   HCT 35.6* 12/12/2015   MCV 86.0 12/12/2015   PLT 207 12/12/2015     Physical Exam @ time of discharge:  Filed Vitals:   12/12/15 0259 12/12/15 0604 12/12/15 1807 12/13/15 0617  BP: 126/80 129/88 123/81 142/79  Pulse: 90 79 93 87  Temp:  97.8 F (36.6 C) 98 F (36.7 C) 98 F (36.7 C)  TempSrc:  Oral Oral Oral  Resp:  18 18 87  Height:      Weight:      SpO2:        General: alert, cooperative and no distress Lochia: appropriate Uterine Fundus: firm Perineum: mod ecchymosis, approximating well, mild edema Incision: N/A Extremities: No evidence of DVT seen on physical exam. +1 pedal and pretib edema   CMP Latest Ref Rng 12/11/2015  Glucose 65 - 99 mg/dL 629(B120(H)  BUN 6 - 20 mg/dL 13  Creatinine 2.840.44 - 1.321.00 mg/dL 4.400.67  Sodium 135 - 145 mmol/L 135  Potassium 3.5 - 5.1 mmol/L 4.1  Chloride 101 - 111 mmol/L 103  CO2 22 - 32 mmol/L 19(L)  Calcium 8.9 - 10.3 mg/dL 1.6(X)  Total Protein 6.5 - 8.1 g/dL 6.3(L)  Total Bilirubin 0.3 - 1.2 mg/dL 0.4  Alkaline Phos 38 - 126 U/L 143(H)  AST 15 - 41 U/L 22  ALT 14 - 54 U/L 15   Discharge Medications:    Medication List    TAKE these medications        acetaminophen 500 MG tablet  Commonly known as:  TYLENOL  Take 1,000 mg by mouth every 6 (six) hours as needed for moderate pain.     acetaminophen 325 MG tablet  Commonly known as:  TYLENOL  Take 650 mg by mouth every 6 (six) hours as needed for moderate pain.     bisacodyl 5 MG EC tablet  Generic drug:  bisacodyl  Take 10 mg by mouth daily as needed for moderate constipation.     IMODIUM A-D 2 MG capsule  Generic drug:  loperamide  Take 4 mg by mouth as needed for diarrhea or loose stools.     mometasone-formoterol 100-5 MCG/ACT Aero  Commonly known as:  DULERA  Inhale 2 puffs into the lungs 2 (two) times daily.     oxyCODONE-acetaminophen 5-325 MG tablet  Commonly known as:  PERCOCET/ROXICET  Take 1-2 tablets by mouth every 4 (four) hours  as needed for moderate pain (pain).     prenatal multivitamin Tabs tablet  Take 1 tablet by mouth daily at 12 noon.         Discharge instructions:  "Baby and Me Booklet" and [facility] Book[facility]  Diet: routine diet  Activity: Advance as tolerated. Pelvic rest x 6 weeks.   Follow up:6 weeks    Jessica Revel, MSN, [facility] 12/13/2015, 8:32 AM

## 2015-12-18 ENCOUNTER — Encounter (HOSPITAL_COMMUNITY): Payer: Self-pay | Admitting: *Deleted

## 2015-12-18 ENCOUNTER — Inpatient Hospital Stay (HOSPITAL_COMMUNITY)
Admission: AD | Admit: 2015-12-18 | Discharge: 2015-12-18 | Disposition: A | Discharge status: Home / Self Care | Payer: Managed Care, Other (non HMO) | Source: Ambulatory Visit | Attending: Obstetrics and Gynecology | Admitting: Obstetrics and Gynecology

## 2015-12-18 DIAGNOSIS — R110 Nausea: Secondary | ICD-10-CM | POA: Insufficient documentation

## 2015-12-18 DIAGNOSIS — O1404 Mild to moderate pre-eclampsia, complicating childbirth: Secondary | ICD-10-CM

## 2015-12-18 DIAGNOSIS — R102 Pelvic and perineal pain: Secondary | ICD-10-CM | POA: Insufficient documentation

## 2015-12-18 DIAGNOSIS — O1495 Unspecified pre-eclampsia, complicating the puerperium: Secondary | ICD-10-CM | POA: Diagnosis not present

## 2015-12-18 DIAGNOSIS — N949 Unspecified condition associated with female genital organs and menstrual cycle: Secondary | ICD-10-CM

## 2015-12-18 DIAGNOSIS — R51 Headache: Secondary | ICD-10-CM | POA: Insufficient documentation

## 2015-12-18 DIAGNOSIS — Z9889 Other specified postprocedural states: Secondary | ICD-10-CM | POA: Diagnosis not present

## 2015-12-18 DIAGNOSIS — Z8249 Family history of ischemic heart disease and other diseases of the circulatory system: Secondary | ICD-10-CM | POA: Diagnosis not present

## 2015-12-18 LAB — COMPREHENSIVE METABOLIC PANEL
ALBUMIN: 3.1 g/dL — AB (ref 3.5–5.0)
ALK PHOS: 96 U/L (ref 38–126)
ALT: 29 U/L (ref 14–54)
ANION GAP: 9 (ref 5–15)
AST: 33 U/L (ref 15–41)
BILIRUBIN TOTAL: 0.6 mg/dL (ref 0.3–1.2)
BUN: 13 mg/dL (ref 6–20)
CALCIUM: 8.9 mg/dL (ref 8.9–10.3)
CO2: 22 mmol/L (ref 22–32)
Chloride: 109 mmol/L (ref 101–111)
Creatinine, Ser: 0.6 mg/dL (ref 0.44–1.00)
GFR calc Af Amer: >60 mL/min (ref >60)
GFR calc non Af Amer: >60 mL/min (ref >60)
GLUCOSE: 103 mg/dL — AB (ref 65–99)
Potassium: 3.9 mmol/L (ref 3.5–5.1)
Sodium: 140 mmol/L (ref 135–145)
TOTAL PROTEIN: 6.4 g/dL — AB (ref 6.5–8.1)

## 2015-12-18 LAB — URINALYSIS, ROUTINE W REFLEX MICROSCOPIC
BILIRUBIN URINE: NEGATIVE
GLUCOSE, UA: NEGATIVE mg/dL
KETONES UR: NEGATIVE mg/dL
Nitrite: NEGATIVE
PH: 6 (ref 5.0–8.0)
Protein, ur: 30 mg/dL — AB
Specific Gravity, Urine: <1.005 — ABNORMAL LOW (ref 1.005–1.030)

## 2015-12-18 LAB — URINE MICROSCOPIC-ADD ON

## 2015-12-18 LAB — CBC WITH DIFFERENTIAL/PLATELET
BASOS PCT: 0 %
Basophils Absolute: 0 10*3/uL (ref 0.0–0.1)
Eosinophils Absolute: 0.1 10*3/uL (ref 0.0–0.7)
Eosinophils Relative: 1 %
HEMATOCRIT: 34.5 % — AB (ref 36.0–46.0)
HEMOGLOBIN: 11.2 g/dL — AB (ref 12.0–15.0)
LYMPHS ABS: 1.7 10*3/uL (ref 0.7–4.0)
LYMPHS PCT: 17 %
MCH: 28.1 pg (ref 26.0–34.0)
MCHC: 32.5 g/dL (ref 30.0–36.0)
MCV: 86.5 fL (ref 78.0–100.0)
MONOS PCT: 4 %
Monocytes Absolute: 0.4 10*3/uL (ref 0.1–1.0)
NEUTROS ABS: 7.5 10*3/uL (ref 1.7–7.7)
NEUTROS PCT: 78 %
Platelets: 259 10*3/uL (ref 150–400)
RBC: 3.99 MIL/uL (ref 3.87–5.11)
RDW: 15.9 % — ABNORMAL HIGH (ref 11.5–15.5)
WBC: 9.7 10*3/uL (ref 4.0–10.5)

## 2015-12-18 LAB — PROTEIN / CREATININE RATIO, URINE
Creatinine, Urine: 83 mg/dL
Protein Creatinine Ratio: 1.05 mg/mg{Cre} — ABNORMAL HIGH (ref 0.00–0.15)
Total Protein, Urine: 87 mg/dL

## 2015-12-18 LAB — URIC ACID: Uric Acid, Serum: 5.5 mg/dL (ref 2.3–6.6)

## 2015-12-18 LAB — LACTATE DEHYDROGENASE: LDH: 168 U/L (ref 98–192)

## 2015-12-18 MED ORDER — ONDANSETRON 8 MG PO TBDP
8.0000 mg | ORAL_TABLET | Freq: Once | ORAL | Status: AC
  Administered 2015-12-18: 8 mg via ORAL
  Filled 2015-12-18: qty 1

## 2015-12-18 NOTE — Discharge Instructions (Signed)
How to Take a Sitz Bath A sitz bath is a warm water bath that is taken while you are sitting down. The water should only come up to your hips and should cover your buttocks. Your health care provider may recommend a sitz bath to help you:   Clean the lower part of your body, including your genital area.  With itching.  With pain.  With sore muscles or muscles that tighten or spasm. HOW TO TAKE A SITZ BATH Take 3-4 sitz baths per day or as told by your health care provider.  Partially fill a bathtub with warm water. You will only need the water to be deep enough to cover your hips and buttocks when you are sitting in it.  If your health care provider told you to put medicine in the water, follow the directions exactly.  Sit in the water and open the tub drain a little.  Turn on the warm water again to keep the tub at the correct level. Keep the water running constantly.  Soak in the water for 15-20 minutes or as told by your health care provider.  After the sitz bath, pat the affected area dry first. Do not rub it.  Be careful when you stand up after the sitz bath because you may feel dizzy. SEEK MEDICAL CARE IF:  Your symptoms get worse. Do not continue with sitz baths if your symptoms get worse.  You have new symptoms. Do not continue with sitz baths until you talk with your health care provider.   This information is not intended to replace advice given to you by your health care provider. Make sure you discuss any questions you have with your health care provider.   Document Released: 03/29/2004 Document Revised: 11/21/2014 Document Reviewed: 07/05/2014 Elsevier Interactive Patient Education 2016 Elsevier Inc. Preeclampsia and Eclampsia Preeclampsia is a serious condition that develops only during pregnancy. It is also called toxemia of pregnancy. This condition causes high blood pressure along with other symptoms, such as swelling and headaches. These may develop as the  condition gets worse. Preeclampsia may occur 20 weeks or later into your pregnancy.  Diagnosing and treating preeclampsia early is very important. If not treated early, it can cause serious problems for you and your baby. One problem it can lead to is eclampsia, which is a condition that causes muscle jerking or shaking (convulsions) in the mother. Delivering your baby is the best treatment for preeclampsia or eclampsia.  RISK FACTORS The cause of preeclampsia is not known. You may be more likely to develop preeclampsia if you have certain risk factors. These include:   Being pregnant for the first time.  Having preeclampsia in a past pregnancy.  Having a family history of preeclampsia.  Having high blood pressure.  Being pregnant with twins or triplets.  Being 36 or older.  Being African American.  Having kidney disease or diabetes.  Having medical conditions such as lupus or blood diseases.  Being very overweight (obese). SIGNS AND SYMPTOMS  The earliest signs of preeclampsia are:  High blood pressure.  Increased protein in your urine. Your health care provider will check for this at every prenatal visit. Other symptoms that can develop include:   Severe headaches.  Sudden weight gain.  Swelling of your hands, face, legs, and feet.  Feeling sick to your stomach (nauseous) and throwing up (vomiting).  Vision problems (blurred or double vision).  Numbness in your face, arms, legs, and feet.  Dizziness.  Slurred speech.  Sensitivity to bright lights.  Abdominal pain. DIAGNOSIS  There are no screening tests for preeclampsia. Your health care provider will ask you about symptoms and check for signs of preeclampsia during your prenatal visits. You may also have tests, including:  Urine testing.  Blood testing.  Checking your baby's heart rate.  Checking the health of your baby and your placenta using images created with sound waves (ultrasound). TREATMENT    You can work out the best treatment approach together with your health care provider. It is very important to keep all prenatal appointments. If you have an increased risk of preeclampsia, you may need more frequent prenatal exams.  Your health care provider may prescribe bed rest.  You may have to eat as little salt as possible.  You may need to take medicine to lower your blood pressure if the condition does not respond to more conservative measures.  You may need to stay in the hospital if your condition is severe. There, treatment will focus on controlling your blood pressure and fluid retention. You may also need to take medicine to prevent seizures.  If the condition gets worse, your baby may need to be delivered early to protect you and the baby. You may have your labor started with medicine (be induced), or you may have a cesarean delivery.  Preeclampsia usually goes away after the baby is born. HOME CARE INSTRUCTIONS   Only take over-the-counter or prescription medicines as directed by your health care provider.  Lie on your left side while resting. This keeps pressure off your baby.  Elevate your feet while resting.  Get regular exercise. Ask your health care provider what type of exercise is safe for you.  Avoid caffeine and alcohol.  Do not smoke.  Drink 6-8 glasses of water every day.  Eat a balanced diet that is low in salt. Do not add salt to your food.  Avoid stressful situations as much as possible.  Get plenty of rest and sleep.  Keep all prenatal appointments and tests as scheduled. SEEK MEDICAL CARE IF:  You are gaining more weight than expected.  You have any headaches, abdominal pain, or nausea.  You are bruising more than usual.  You feel dizzy or light-headed. SEEK IMMEDIATE MEDICAL CARE IF:   You develop sudden or severe swelling anywhere in your body. This usually happens in the legs.  You gain 5 lb (2.3 kg) or more in a week.  You have a  severe headache, dizziness, problems with your vision, or confusion.  You have severe abdominal pain.  You have lasting nausea or vomiting.  You have a seizure.  You have trouble moving any part of your body.  You develop numbness in your body.  You have trouble speaking.  You have any abnormal bleeding.  You develop a stiff neck.  You pass out. MAKE SURE YOU:   Understand these instructions.  Will watch your condition.  Will get help right away if you are not doing well or get worse.   This information is not intended to replace advice given to you by your health care provider. Make sure you discuss any questions you have with your health care provider.   Document Released: 07/04/2000 Document Revised: 07/12/2013 Document Reviewed: 04/29/2013 Elsevier Interactive Patient Education [employer]2016 Elsevier Inc.

## 2015-12-18 NOTE — MAU Note (Signed)
Pt reports she started having sharp pain in her perineum and vomiting for the last 4 hours

## 2015-12-18 NOTE — MAU Note (Signed)
B-ROOM-   THEN SPRITE

## 2015-12-18 NOTE — MAU Note (Signed)
PT SAYS SHE DEL  VAG  ON Tuesday  BY  DR  Billy CoastAAVON.    SAYS AT 0100-  SHE HAD SHARP  PAIN IN PERINEUM - ONLY WHEN SHE MOVES.    VAG BLEEDING OK   SHE CALLED ROLITTA-  TOLD  TO COME IN .      BREAST FEEDING.

## 2015-12-18 NOTE — MAU Provider Note (Signed)
History     CSN: [id number]  Arrival date and time: 12/18/15 0430   First Provider Initiated Contact with Patient 12/18/15 [phone]      Chief Complaint  Patient presents with  . Vaginal Pain   HPI Ms. Jessica Hooper is a 30 y.o. G1P1001 who delivered by VAVD on 12/11/15 with a second degree laceration who presents to MAU today with complaint of pain in her perineum since earlier tonight with walking. Patient without pain now while seated.  She continues to have light vaginal bleeding, but denies odor or discharge. She has had N/V x 2 tonight and continues to be nauseated now. She denies diarrhea, but had loose stool yesterday. She has taken Tylenol for pain, last dose at 2300 last night, unsure if this was prior to onset of perineal pain. She is breastfeeding. She denies issues with HTN in the past. She endorses mild headache, but denies blurred vision, floaters, RUQ abdominal pain or peripheral edema.   OB History    Gravida Para Term Preterm AB TAB SAB Ectopic Multiple Living   1 1 1       0 1      Past Medical History  Diagnosis Date  . Neck injury   . Back pain   . Asthma   . GERD (gastroesophageal reflux disease)   . Hx of varicella   . Postpartum care following vaginal delivery 12/12/2015    Past Surgical History  Procedure Laterality Date  . Mouth surgery      Family History  Problem Relation Age of Onset  . Hypertension Mother   . Cancer Father   . Hypertension Father   . Birth defects Brother     cleft lip  . Cancer Maternal Grandmother   . Hypertension Maternal Grandmother   . Cancer Maternal Grandfather   . Hypertension Maternal Grandfather   . Cancer Paternal Grandmother     Social History  Substance Use Topics  . Smoking status: Never Smoker   . Smokeless tobacco: None  . Alcohol Use: No    Allergies: No Known Allergies  No prescriptions prior to admission    Review of Systems  Constitutional: Negative for fever and malaise/fatigue.  Eyes:  Negative for blurred vision.       Neg - floaters  Cardiovascular: Negative for leg swelling.  Gastrointestinal: Positive for nausea and vomiting. Negative for abdominal pain, diarrhea and constipation.  Genitourinary: Negative for dysuria, urgency and frequency.       + vaginal bleeding Neg - vaginal discharge, odor + vaginal pain  Neurological: Positive for headaches.   Physical Exam   Blood pressure 137/90, pulse 87, temperature 98.1 F (36.7 C), temperature source Oral, resp. rate 18, height 5\' 1"  (1.549 m), weight 211 lb (95.709 kg), last menstrual period 03/15/2015, SpO2 100 %, unknown if currently breastfeeding.  Physical Exam  Nursing note and vitals reviewed. Constitutional: She is oriented to person, place, and time. She appears well-developed and well-nourished. No distress.  HENT:  Head: Normocephalic and atraumatic.  Cardiovascular: Normal rate, regular rhythm and normal heart sounds.   Respiratory: Effort normal and breath sounds normal. No respiratory distress.  GI: Soft. She exhibits no distension and no mass. There is no tenderness. There is no rebound and no guarding.  Genitourinary:     Musculoskeletal: She exhibits no edema.  Neurological: She is alert and oriented to person, place, and time. She has normal reflexes.  No clonus  Skin: Skin is warm and dry. No erythema.  Psychiatric: She has a normal mood and affect.    Results for orders placed or performed during the hospital encounter of 12/18/15 (from the past 24 hour(s))  Urinalysis, Routine w reflex microscopic (not at [facility])     Status: Abnormal   Collection Time: 12/18/15  5:00 AM  Result Value Ref Range   Color, Urine ORANGE (A) YELLOW   APPearance CLEAR CLEAR   Specific Gravity, Urine <1.005 (L) 1.005 - 1.030   pH 6.0 5.0 - 8.0   Glucose, UA NEGATIVE NEGATIVE mg/dL   Hgb urine dipstick LARGE (A) NEGATIVE   Bilirubin Urine NEGATIVE NEGATIVE   Ketones, ur NEGATIVE NEGATIVE mg/dL   Protein, ur 30  (A) NEGATIVE mg/dL   Nitrite NEGATIVE NEGATIVE   Leukocytes, UA LARGE (A) NEGATIVE  Urine microscopic-add on     Status: Abnormal   Collection Time: 12/18/15  5:00 AM  Result Value Ref Range   Squamous Epithelial / LPF 0-5 (A) NONE SEEN   WBC, UA 6-30 0 - 5 WBC/hpf   RBC / HPF 6-30 0 - 5 RBC/hpf   Bacteria, UA FEW (A) NONE SEEN  Protein / creatinine ratio, urine     Status: Abnormal   Collection Time: 12/18/15  5:30 AM  Result Value Ref Range   Creatinine, Urine 83.00 mg/dL   Total Protein, Urine 87 mg/dL   Protein Creatinine Ratio 1.05 (H) 0.00 - 0.15 mg/mg[Cre]  CBC with Differential/Platelet     Status: Abnormal   Collection Time: 12/18/15  5:49 AM  Result Value Ref Range   WBC 9.7 4.0 - 10.5 K/uL   RBC 3.99 3.87 - 5.11 MIL/uL   Hemoglobin 11.2 (L) 12.0 - 15.0 g/dL   HCT 16.1 (L) 09.6 - 04.5 %   MCV 86.5 78.0 - 100.0 fL   MCH 28.1 26.0 - 34.0 pg   MCHC 32.5 30.0 - 36.0 g/dL   RDW 40.9 (H) 81.1 - 91.4 %   Platelets 259 150 - 400 K/uL   Neutrophils Relative % 78 %   Neutro Abs 7.5 1.7 - 7.7 K/uL   Lymphocytes Relative 17 %   Lymphs Abs 1.7 0.7 - 4.0 K/uL   Monocytes Relative 4 %   Monocytes Absolute 0.4 0.1 - 1.0 K/uL   Eosinophils Relative 1 %   Eosinophils Absolute 0.1 0.0 - 0.7 K/uL   Basophils Relative 0 %   Basophils Absolute 0.0 0.0 - 0.1 K/uL  Comprehensive metabolic panel     Status: Abnormal   Collection Time: 12/18/15  5:49 AM  Result Value Ref Range   Sodium 140 135 - 145 mmol/L   Potassium 3.9 3.5 - 5.1 mmol/L   Chloride 109 101 - 111 mmol/L   CO2 22 22 - 32 mmol/L   Glucose, Bld 103 (H) 65 - 99 mg/dL   BUN 13 6 - 20 mg/dL   Creatinine, Ser 7.82 0.44 - 1.00 mg/dL   Calcium 8.9 8.9 - 95.6 mg/dL   Total Protein 6.4 (L) 6.5 - 8.1 g/dL   Albumin 3.1 (L) 3.5 - 5.0 g/dL   AST 33 15 - 41 U/L   ALT 29 14 - 54 U/L   Alkaline Phosphatase 96 38 - 126 U/L   Total Bilirubin 0.6 0.3 - 1.2 mg/dL   GFR calc non Af Amer >60 >60 mL/min   GFR calc Af Amer >60 >60  mL/min   Anion gap 9 5 - 15  Uric acid     Status: None   Collection  Time: 12/18/15  5:49 AM  Result Value Ref Range   Uric Acid, Serum 5.5 2.3 - 6.6 mg/dL  Lactate dehydrogenase     Status: None   Collection Time: 12/18/15  5:49 AM  Result Value Ref Range   LDH 168 98 - 192 U/L    Patient Vitals for the past 24 hrs:  BP Temp Temp src Pulse Resp SpO2 Height Weight  12/18/15 0624 137/90 mmHg - - 87 - - - -  12/18/15 0600 146/92 mmHg - - 73 - - - -  12/18/15 0545 144/92 mmHg - - 66 - - - -  12/18/15 0540 138/79 mmHg - - 68 - - - -  12/18/15 0534 137/78 mmHg - - 67 - - - -  12/18/15 0455 145/88 mmHg 98.1 F (36.7 C) Oral 79 - - - -  12/18/15 0438 (!) 148/101 mmHg 97.7 F (36.5 C) Oral 83 18 100 % 5\' 1"  (1.549 m) 211 lb (95.709 kg)    MAU Course  Procedures None  MDM Serial BPs UA (clean catch), CBC, CMP, Uric Acid, LDH and Urine protein/creatinine ratio (cath sample collected)  8 mg ODT Zofran given for nausea. Patient states nausea has improved. No additional emesis while in MAU. Able to tolerate PO liquids.  Discussed patient with Dr. Ernestina PennaFogleman including VS, labs and patient presentation and exam. Dr. Ernestina PennaFogleman recommends sitz baths for perineal pain and patient may be discharged with strict precautions and advised to follow-up in the office for BP check in 48 hours or sooner PRN. She does not feel that admission for Mag is needed at this time since patient's exam is normal, only symptom is mild headache and aside from Protein/Creatinine ratio, labs are not concerning for pre-eclampsia.   Assessment and Plan  A: PPD # 7 PP pre-eclampsia Perineal pain, recent 2nd degree laceration   P: Discharge home Continue Tylenol PRN for pain Sitz baths advised for perineal discomfort Strict pre-eclampsia precautions discussed Patient advised to follow-up with [facility] in 48 hours for BP check or sooner if concerning symptoms arise Patient may return to MAU as needed or if her  condition were to change or worsen   Marny LowensteinJulie N Wenzel, PA-C  12/18/2015, 7:10 AM

## 2015-12-19 ENCOUNTER — Inpatient Hospital Stay (HOSPITAL_COMMUNITY)
Admission: AD | Admit: 2015-12-19 | Payer: No Typology Code available for payment source | Source: Ambulatory Visit | Admitting: Obstetrics and Gynecology

## 2015-12-21 ENCOUNTER — Ambulatory Visit (HOSPITAL_COMMUNITY): Admission: RE | Admit: 2015-12-21 | Payer: No Typology Code available for payment source | Source: Ambulatory Visit

## 2015-12-28 ENCOUNTER — Ambulatory Visit (INDEPENDENT_AMBULATORY_CARE_PROVIDER_SITE_OTHER): Payer: Managed Care, Other (non HMO) | Admitting: Pulmonary Disease

## 2015-12-28 ENCOUNTER — Encounter (INDEPENDENT_AMBULATORY_CARE_PROVIDER_SITE_OTHER): Payer: Self-pay

## 2015-12-28 ENCOUNTER — Encounter: Payer: Self-pay | Admitting: Pulmonary Disease

## 2015-12-28 VITALS — BP 112/78 | HR 100 | Ht 61.0 in | Wt 214.0 lb

## 2015-12-28 DIAGNOSIS — J4520 Mild intermittent asthma, uncomplicated: Secondary | ICD-10-CM

## 2015-12-28 DIAGNOSIS — G4710 Hypersomnia, unspecified: Secondary | ICD-10-CM | POA: Diagnosis not present

## 2015-12-28 DIAGNOSIS — R0609 Other forms of dyspnea: Secondary | ICD-10-CM | POA: Diagnosis not present

## 2015-12-28 MED ORDER — ALBUTEROL SULFATE HFA 108 (90 BASE) MCG/ACT IN AERS: 2.0000 | INHALATION_SPRAY | Freq: Four times a day (QID) | RESPIRATORY_TRACT | Status: AC | PRN

## 2015-12-28 MED ORDER — BUDESONIDE 180 MCG/ACT IN AEPB: 2.0000 | INHALATION_SPRAY | Freq: Two times a day (BID) | RESPIRATORY_TRACT | Status: DC

## 2015-12-28 NOTE — Patient Instructions (Signed)
It was a pleasure taking care of you today!  You are diagnosed with Asthma.   Asthma is a chronic disease that affects the airways of your lungs.When you have asthma, your airways become swollen. The swelling causes the airways to make thick, sticky secretions called mucus.   Asthma also causes the muscles in and around your airways to get very tight.  This swelling, mucus, and tight muscles can make your airways narrower than normal and it becomes very hard for you to get air into and out of your lungs.   Sometimes, when you have a lung infection, this can make your breathing worse, and will cause you to  have an asthma flare-up. Please call your primary care doctor or the office if you are having an asthma flare-up.   Smoking makes asthma worse.   Make sure you use your medications for asthma -- Maintenance medications : Pulmicort  180 mcg/puff 2 puffs 2x/day. Rinse mouth with each use.   Rescue medications: Albuterol 2 puffs every 4 hours as needed for shortness of breath.  Please rinse your mouth each time you use your maintenance medication.   Please call the office if you are having issues with your medications   Return to clinic in 3-4 months, sooner if with more symptoms.

## 2015-12-28 NOTE — Progress Notes (Signed)
Subjective:    Patient ID: Jessica Hooper, female    DOB: Feb 12, 1986, 30 y.o.   MRN: [id number]  HPI   This is the case of Jessica Hooper, 30 y.o. Female, who is here for asthma.   As you very well know, patient is a non smoker.  She was dxed with asthma in 05/2013.  During that time she was living in [place] and was being seen by a pulmonologist Dr. Philis KendallJoshua Aaaron in [place], [place]. Work up revealed pt to have asthma. Allergy testing was (-). Pt was started on Dulera high dose and eventually was on a lower dose at 1-2 P BID.  Pt and husband moved to [place] in 09/2014.  Asthma was better and she was able to wean off Dulera. Pt got pregnant and delivered 2 weeks ago. Asthma remained stable during pregnancy and delivery.  She did NOT have to use dulera or albuterol.  The last 1-2 weeks, pt started to have episodic wheezing, chest tightness, cough resembling her asthma flare.  She ran out of dulera so she made this appointment. Has not used any meds last 2 weeks.   She saw GYN and was noted to be HTN. Started on labetalol. Does not seen to be in heart failure. (-) edema or orthopnea. Has episodic SOB which is made worse by hot/cool environment (it is hot outside but she tries to stay inside the house with the baby where it is cool).   Ne recent infection or abx.    Review of Systems  Constitutional: Negative.  Negative for fever and unexpected weight change.  HENT: Positive for congestion, postnasal drip and rhinorrhea. Negative for dental problem, ear pain, nosebleeds, sinus pressure, sneezing, sore throat and trouble swallowing.   Eyes: Negative.  Negative for redness and itching.  Respiratory: Positive for chest tightness, shortness of breath and wheezing. Negative for cough.   Cardiovascular: Negative.  Negative for palpitations and leg swelling.  Gastrointestinal: Negative.  Negative for nausea and vomiting.  Endocrine: Negative.   Genitourinary: Negative.  Negative for dysuria.    Musculoskeletal: Negative.  Negative for joint swelling.  Skin: Negative.  Negative for rash.  Allergic/Immunologic: Positive for environmental allergies.  Neurological: Negative.  Negative for headaches.  Hematological: Negative.  Does not bruise/bleed easily.  Psychiatric/Behavioral: Negative.  Negative for dysphoric mood. The patient is not nervous/anxious.    Past Medical History  Diagnosis Date  . Neck injury   . Back pain   . Asthma   . GERD (gastroesophageal reflux disease)   . Hx of varicella   . Postpartum care following vaginal delivery 12/12/2015    (-) CA, DVT  Family History  Problem Relation Age of Onset  . Hypertension Mother   . Cancer Father   . Hypertension Father   . Birth defects Brother     cleft lip  . Cancer Maternal Grandmother   . Hypertension Maternal Grandmother   . Cancer Maternal Grandfather   . Hypertension Maternal Grandfather   . Cancer Paternal Grandmother      Past Surgical History  Procedure Laterality Date  . Mouth surgery      Social History   Social History  . Marital Status: Married    Spouse Name: N/A  . Number of Children: N/A  . Years of Education: N/A   Occupational History  . Not on file.   Social History Main Topics  . Smoking status: Never Smoker   . Smokeless tobacco: Not on file  . Alcohol Use:  No  . Drug Use: No  . Sexual Activity: Not on file   Other Topics Concern  . Not on file   Social History Narrative   Married, from [place], moved to [place] in 09/2014.  Works from home.   No Known Allergies   Outpatient Prescriptions Prior to Visit  Medication Sig Dispense Refill  . bisacodyl (BISACODYL) 5 MG EC tablet Take 10 mg by mouth daily as needed for moderate constipation.    Marland Kitchen ibuprofen (ADVIL,MOTRIN) 200 MG tablet Take 200 mg by mouth every 6 (six) hours as needed.    . loperamide (IMODIUM A-D) 2 MG capsule Take 4 mg by mouth as needed for diarrhea or loose stools.    . Prenatal Vit-Fe Fumarate-FA  (PRENATAL MULTIVITAMIN) TABS tablet Take 1 tablet by mouth daily at 12 noon.    . mometasone-formoterol (DULERA) 100-5 MCG/ACT AERO Inhale 2 puffs into the lungs 2 (two) times daily. Reported on 12/28/2015    . acetaminophen (TYLENOL) 325 MG tablet Take 650 mg by mouth every 6 (six) hours as needed for moderate pain.    Marland Kitchen acetaminophen (TYLENOL) 500 MG tablet Take 1,000 mg by mouth every 6 (six) hours as needed for moderate pain.    Marland Kitchen oxyCODONE-acetaminophen (PERCOCET/ROXICET) 5-325 MG tablet Take 1-2 tablets by mouth every 4 (four) hours as needed for moderate pain (pain). 12 tablet 0   No facility-administered medications prior to visit.   Meds ordered this encounter  Medications  . labetalol (NORMODYNE) 100 MG tablet    Sig: Take 1 tablet by mouth daily.  . ondansetron (ZOFRAN-ODT) 8 MG disintegrating tablet    Sig: Take 1 tablet by mouth as needed.  . budesonide (PULMICORT) 180 MCG/ACT inhaler    Sig: Inhale 2 puffs into the lungs 2 (two) times daily.    Dispense:  1 Inhaler    Refill:  5  . albuterol (PROVENTIL HFA;VENTOLIN HFA) 108 (90 Base) MCG/ACT inhaler    Sig: Inhale 2 puffs into the lungs every 6 (six) hours as needed for wheezing or shortness of breath.    Dispense:  1 Inhaler    Refill:  6          Objective:   Physical Exam   Vitals:  Filed Vitals:   12/28/15 1618  BP: 112/78  Pulse: 100  Height: 5\' 1"  (1.549 m)  Weight: 214 lb (97.07 kg)  SpO2: 98%    Constitutional/General:  Pleasant, well-nourished, well-developed, not in any distress,  Comfortably seating.  Well kempt  Body mass index is 40.46 kg/(m^2). Wt Readings from Last 3 Encounters:  12/28/15 214 lb (97.07 kg)  12/18/15 211 lb (95.709 kg)  12/11/15 226 lb (102.513 kg)    HEENT: Pupils equal and reactive to light and accommodation. Anicteric sclerae. Normal nasal mucosa.   No oral  lesions,  mouth clear,  oropharynx clear, no postnasal drip. (-) Oral thrush. No dental caries.  Airway -  Mallampati class III  Neck: No masses. Midline trachea. No JVD, (-) LAD. (-) bruits appreciated.  Respiratory/Chest: Grossly normal chest. (-) deformity. (-) Accessory muscle use.  Symmetric expansion. (-) Tenderness on palpation.  Resonant on percussion.  Diminished BS on both lower lung zones. (-) wheezing heard, crackles, rhonchi (-) egophony  Cardiovascular: Regular rate and  rhythm, heart sounds normal, no murmur or gallops,trace  peripheral edema  Gastrointestinal:  Normal bowel sounds. Soft, non-tender. No hepatosplenomegaly.  (-) masses.   Musculoskeletal:  Normal muscle tone. Normal gait.   Extremities: Grossly  normal. (-) clubbing, cyanosis.  Trace edema.   Skin: (-) rash,lesions seen.   Neurological/Psychiatric : alert, oriented to time, place, person. Normal mood and affect           Assessment & Plan:  Asthma, chronic Dxed with asthma in [place] in 05/2013. Was seeing Dr. Owens Shark. Was on [facility] but weaned off when she moved to [place] in 09/2014. Got pregnant and delivered 2 weeks ago. Asthma not worse with pregnancy. Episodic SOB, wheeze, chest tightness, cough x 2 weeks. Worse with temp changes. No meds.  Asthma mildly falred up clinically postpartum. (-) wheezing noted. D/w re: meds for asthma in lactating mothers.  Safest is pulmicort flexihaler BUT it is only steroids (no LABAs).  D/w pt re: other meds such as symbicort or advair which might be better for asthma control BUT safety is not certain in lactation. She understands that asthma control is important in order for her to take care of her baby.  Plan : 1. Start pulmicort flexihaler 180 mcg 2 P BID. Gave coupon. 2. If asthma is not controlled with pulmicort, plan to switch pt to symbicort 160/4.5 2P BID. Will need spacer. 3. NO need for pred now. 4. Singulair or zyrtec are other options. 5. Albuterol prn. 6. Will need PFT, ABG on f/u.   Exertional dyspnea Pt with episodic exertional dyspnea post  partum. No signs of heart failure. Also HTN post partume. (-) wheeze. Trace edema.  Observe for now. May need echo / CXR if SOB is persistent.   Hypersomnia Related to being postpartum. Pt with crowded airway, with HTN. On and off snoring. (-) witnessed apneas. Will observe for now. May need ONO or PSG. Advised husband to observe pt.    Return to clinic in 3-4 months. Pt advised to call if sx are worse.    Pollie Meyer, MD 12/29/2015, 12:43 AM [facility] Pager [phone] After 3 pm or if no answer, call [phone]

## 2015-12-29 DIAGNOSIS — J45909 Unspecified asthma, uncomplicated: Secondary | ICD-10-CM | POA: Insufficient documentation

## 2015-12-29 DIAGNOSIS — G4710 Hypersomnia, unspecified: Secondary | ICD-10-CM | POA: Insufficient documentation

## 2015-12-29 DIAGNOSIS — R0609 Other forms of dyspnea: Secondary | ICD-10-CM

## 2015-12-29 NOTE — Assessment & Plan Note (Signed)
Pt with episodic exertional dyspnea post partum. No signs of heart failure. Also HTN post partume. (-) wheeze. Trace edema.  Observe for now. May need echo / CXR if SOB is persistent.

## 2015-12-29 NOTE — Assessment & Plan Note (Signed)
Related to being postpartum. Pt with crowded airway, with HTN. On and off snoring. (-) witnessed apneas. Will observe for now. May need ONO or PSG. Advised husband to observe pt.  

## 2015-12-29 NOTE — Assessment & Plan Note (Addendum)
Dxed with asthma in [place] in 05/2013. Was seeing Dr. Owens SharkAaaron. [facility] but weaned off when she moved to [place] in 09/2014. Got pregnant and delivered 2 weeks ago. Asthma not worse with pregnancy. Episodic SOB, wheeze, chest tightness, cough x 2 weeks. Worse with temp changes. No meds.  Asthma mildly falred up clinically postpartum. (-) wheezing noted. D/w re: meds for asthma in lactating mothers.  Safest is pulmicort flexihaler BUT it is only steroids (no LABAs).  D/w pt re: other meds such as symbicort or advair which might be better for asthma control BUT safety is not certain in lactation. She understands that asthma control is important in order for her to take care of her baby.  Plan : 1. Start pulmicort flexihaler 180 mcg 2 P BID. Gave coupon. 2. If asthma is not controlled with pulmicort, plan to switch pt to symbicort 160/4.5 2P BID. Will need spacer. 3. NO need for pred now. 4. Singulair or zyrtec are other options. 5. Albuterol prn. 6. Will need PFT, ABG on f/u.

## 2016-01-01 ENCOUNTER — Telehealth (HOSPITAL_COMMUNITY): Payer: Self-pay | Admitting: Lactation Services

## 2016-01-01 NOTE — Telephone Encounter (Signed)
Baby now 403 weeks old. Not latching well which Mom reports causing her and baby a lot of stress. She has decided to pump/bottle feed. Mom has [place] DEBP. She is pumping every 3-4 hours for 20 minutes receiving 50-70 ml from right breast and 30-40 ml from left breast. Baby is taking 2 1/2 -3 oz every 3 hours. Mom is not pumping at night. When Mom does not pump by 4 hours breast are very full. LC encouraged Mom to pump every 3 hours during the day for 15-30 minutes and 1 time a night to protect milk supply, prevent engorgement which can decrease milk supply.  Refer to Baby N Me booklet page 25 for breast milk storage guidelines. Advised to date her milk and store in back of refrigerator or freezer.  Mom to call for other questions/concerns.  Answered all questions today regarding pump/bottle feeding.

## 2016-04-08 ENCOUNTER — Ambulatory Visit: Payer: Managed Care, Other (non HMO) | Admitting: Pulmonary Disease

## 2016-06-04 ENCOUNTER — Ambulatory Visit: Payer: Managed Care, Other (non HMO) | Admitting: Pulmonary Disease

## 2016-06-18 ENCOUNTER — Encounter: Payer: Self-pay | Admitting: Pulmonary Disease

## 2016-06-18 ENCOUNTER — Ambulatory Visit (INDEPENDENT_AMBULATORY_CARE_PROVIDER_SITE_OTHER): Payer: Managed Care, Other (non HMO) | Admitting: Pulmonary Disease

## 2016-06-18 DIAGNOSIS — G4710 Hypersomnia, unspecified: Secondary | ICD-10-CM | POA: Diagnosis not present

## 2016-06-18 DIAGNOSIS — J45909 Unspecified asthma, uncomplicated: Secondary | ICD-10-CM | POA: Diagnosis not present

## 2016-06-18 DIAGNOSIS — R0609 Other forms of dyspnea: Secondary | ICD-10-CM

## 2016-06-18 MED ORDER — BUDESONIDE-FORMOTEROL FUMARATE 160-4.5 MCG/ACT IN AERO: 2.0000 | INHALATION_SPRAY | Freq: Two times a day (BID) | RESPIRATORY_TRACT | 0 refills | Status: DC

## 2016-06-18 NOTE — Assessment & Plan Note (Signed)
Pt had exertional dyspnea post partum. No signs of heart failure. Also HTN post partume. (-) wheeze. Trace edema.  Has slowly improved through the months. Observe for now. May need echo / CXR if SOB is persistent.

## 2016-06-18 NOTE — Patient Instructions (Signed)
It was a pleasure taking care of you today!  You are diagnosed with Asthma.   Asthma is a chronic disease that affects the airways of your lungs.When you have asthma, your airways become swollen. The swelling causes the airways to make thick, sticky secretions called mucus.   Asthma also causes the muscles in and around your airways to get very tight.  This swelling, mucus, and tight muscles can make your airways narrower than normal and it becomes very hard for you to get air into and out of your lungs.   Sometimes, when you have a lung infection, this can make your breathing worse, and will cause you to  have an asthma flare-up. Please call your primary care doctor or the office if you are having an asthma flare-up.   Smoking makes asthma worse.   Make sure you use your medications for asthma -- Maintenance medications : Symbicort 160/4.5 1-2 P 2x/day as we discussed  Rescue medications: Albuterol 2 puffs every 4 hours as needed for shortness of breath.   Please rinse your mouth each time you use your maintenance medication.   Please call the office if you are having issues with your medications  Return to clinic in 6 months with Dr. Christene Slatese Dios

## 2016-06-18 NOTE — Addendum Note (Signed)
Addended by: Garfield CorneaMABRY, JASMINE L on: 06/18/2016 10:03 AM   Modules accepted: Orders

## 2016-06-18 NOTE — Assessment & Plan Note (Signed)
Dxed with asthma in [place] in 05/2013. Was seeing Dr. Owens SharkAaaron. [facility] but we[facility] off when she moved to [place] in 09/2014. Got pregnant and delivered  In 11/2015. Asthma not worse with pregnancy. Asthma slowly improved and she was able to wean off Pulmicort.  With the change in weather, episodic dyspnea. He uses albuterol and/or Symbicort. No recent hospitalization or severe asthma flare. She is not breast-feeding.  Plan : 1. Continue albuterol, 2 puffs every 4 hours as needed. If the asthma is not controlled with albuterol, she was instructed to use Symbicort 160/4.5, 1-2 puffs twice a day. We will give her a coupon. She has a high deductible. If not better in the Symbicort, she was instructed to call the office. May end up needing prednisone. At present, I do not hear any wheezing and she only has mild symptoms. We extensively discussed asthma plan. 3. Will need PFT and CXR on f/u. Has a lot of deductibles now.  4. Refuses vaccines.

## 2016-06-18 NOTE — Assessment & Plan Note (Signed)
Related to being postpartum. Pt with crowded airway, with HTN. On and off snoring. (-) witnessed apneas. Will observe for now. May need ONO or PSG. Advised husband to observe pt.

## 2016-06-18 NOTE — Progress Notes (Signed)
Subjective:    Patient ID: Jessica Hooper, female    DOB: 08/11/1985, 30 y.o.   MRN: [id number]  HPI   This is the case of Jessica Hooper, 30 y.o. Female, who is here for asthma.   As you very well know, patient is a non smoker.  She was dxed with asthma in 05/2013.  During that time she was living in [place] and was being seen by a pulmonologist Dr. Philis KendallJoshua Aaaron in [place], [place]. Work up revealed pt to have asthma. Allergy testing was (-). Pt was started on Dulera high dose and eventually was on a lower dose at 1-2 P BID.  Pt and husband moved to [place] in 09/2014.  Asthma was better and she was able to wean off Dulera. Pt got pregnant and delivered 2 weeks ago. Asthma remained stable during pregnancy and delivery.  She did NOT have to use dulera or albuterol.  The last 1-2 weeks, pt started to have episodic wheezing, chest tightness, cough resembling her asthma flare.  She ran out of dulera so she made this appointment. Has not used any meds last 2 weeks.   She saw GYN and was noted to be HTN. Started on labetalol. Does not seen to be in heart failure. (-) edema or orthopnea. Has episodic SOB which is made worse by hot/cool environment (it is hot outside but she tries to stay inside the house with the baby where it is cool).   Ne recent infection or abx.     ROV 06/18/16  Patient returns to the office as follow-up on her asthma. Since last seen, she is able to wean off the Pulmicort. Has not been admitted nor has had an asthma flares since last seen. With a change in weather, she gets wheezing every now and then for which she uses albuterol and or Symbicort. Her dyspnea has improved overall. No witnessed apneas.  Review of Systems  Constitutional: Negative.  Negative for fever and unexpected weight change.  HENT: Negative for congestion, dental problem, ear pain, nosebleeds, postnasal drip, rhinorrhea, sinus pressure, sneezing, sore throat and trouble swallowing.   Eyes: Negative.   Negative for redness and itching.  Respiratory: Positive for shortness of breath. Negative for cough, chest tightness and wheezing.   Cardiovascular: Negative.  Negative for palpitations and leg swelling.  Gastrointestinal: Negative.  Negative for nausea and vomiting.  Endocrine: Negative.   Genitourinary: Negative.  Negative for dysuria.  Musculoskeletal: Negative.  Negative for joint swelling.  Skin: Negative.  Negative for rash.  Allergic/Immunologic: Positive for environmental allergies.  Neurological: Negative.  Negative for headaches.  Hematological: Negative.  Does not bruise/bleed easily.  Psychiatric/Behavioral: Negative.  Negative for dysphoric mood. The patient is not nervous/anxious.       Objective:   Physical Exam   Vitals:  Vitals:   06/18/16 0903  BP: 104/78  Pulse: (!) 112  SpO2: 96%  Weight: 212 lb 3.2 oz (96.3 kg)  Height: 5\' 1"  (1.549 m)    Constitutional/General:  Pleasant, well-nourished, well-developed, not in any distress,  Comfortably seating.  Well kempt  Body mass index is 40.09 kg/m. Wt Readings from Last 3 Encounters:  06/18/16 212 lb 3.2 oz (96.3 kg)  12/28/15 214 lb (97.1 kg)  12/18/15 211 lb (95.7 kg)    HEENT: Pupils equal and reactive to light and accommodation. Anicteric sclerae. Normal nasal mucosa.   No oral  lesions,  mouth clear,  oropharynx clear, no postnasal drip. (-) Oral thrush. No dental caries.  Airway - Mallampati class III  Neck: No masses. Midline trachea. No JVD, (-) LAD. (-) bruits appreciated.  Respiratory/Chest: Grossly normal chest. (-) deformity. (-) Accessory muscle use.  Symmetric expansion. (-) Tenderness on palpation.  Resonant on percussion.  Diminished BS on both lower lung zones. (-) wheezing heard, crackles, rhonchi (-) egophony  Cardiovascular: Regular rate and  rhythm, heart sounds normal, no murmur or gallops,trace  peripheral edema  Gastrointestinal:  Normal bowel sounds. Soft, non-tender. No  hepatosplenomegaly.  (-) masses.   Musculoskeletal:  Normal muscle tone. Normal gait.   Extremities: Grossly normal. (-) clubbing, cyanosis.  Trace edema.   Skin: (-) rash,lesions seen.   Neurological/Psychiatric : alert, oriented to time, place, person. Normal mood and affect           Assessment & Plan:  Asthma, chronic Dxed with asthma in [place] in 05/2013. Was seeing Dr. Owens SharkAaaron. [facility] off when she moved to [place] in 09/2014. Got pregnant and delivered  In 11/2015. Asthma not worse with pregnancy. Asthma slowly improved and she was able to wean off Pulmicort.  With the change in weather, episodic dyspnea. He uses albuterol and/or Symbicort. No recent hospitalization or severe asthma flare. She is not breast-feeding.  Plan : 1. Continue albuterol, 2 puffs every 4 hours as needed. If the asthma is not controlled with albuterol, she was instructed to use Symbicort 160/4.5, 1-2 puffs twice a day. We will give her a coupon. She has a high deductible. If not better in the Symbicort, she was instructed to call the office. May end up needing prednisone. At present, I do not hear any wheezing and she only has mild symptoms. We extensively discussed asthma plan. 3. Will need PFT and CXR on f/u. Has a lot of deductibles now.  4. Refuses vaccines.  Exertional dyspnea Pt had exertional dyspnea post partum. No signs of heart failure. Also HTN post partume. (-) wheeze. Trace edema.  Has slowly improved through the months. Observe for now. May need echo / CXR if SOB is persistent.   Hypersomnia Related to being postpartum. Pt with crowded airway, with HTN. On and off snoring. (-) witnessed apneas. Will observe for now. May need ONO or PSG. Advised husband to observe pt.   Return to clinic in 6 months   J. Alexis FrockAngelo A de Dios, MD 06/18/2016, 9:38 AM [facility] Pager [phone] After 3 pm or if no answer, call [phone]

## 2016-11-24 ENCOUNTER — Encounter: Payer: Self-pay | Admitting: Physician Assistant

## 2016-11-24 ENCOUNTER — Ambulatory Visit (INDEPENDENT_AMBULATORY_CARE_PROVIDER_SITE_OTHER): Payer: Managed Care, Other (non HMO) | Admitting: Physician Assistant

## 2016-11-24 VITALS — BP 112/78 | HR 89 | Temp 98.2°F | Ht 61.0 in | Wt 208.0 lb

## 2016-11-24 DIAGNOSIS — E042 Nontoxic multinodular goiter: Secondary | ICD-10-CM

## 2016-11-24 LAB — TSH: TSH: 1.92 u[IU]/mL (ref 0.35–4.50)

## 2016-11-24 LAB — T4, FREE: FREE T4: 0.7 ng/dL (ref 0.60–1.60)

## 2016-11-24 NOTE — Patient Instructions (Signed)
It was great meeting you today!  You will be contacted about your referral to endocrinology.  We will contact you with your lab results.
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
## 2016-12-02 ENCOUNTER — Telehealth: Payer: Self-pay | Admitting: Physician Assistant

## 2016-12-02 NOTE — Telephone Encounter (Signed)
[facility] ,[facility] and Dr. Donne AnonMuhammad Smith
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
## 2016-12-25 ENCOUNTER — Ambulatory Visit (INDEPENDENT_AMBULATORY_CARE_PROVIDER_SITE_OTHER): Payer: Managed Care, Other (non HMO) | Admitting: Endocrinology

## 2016-12-25 ENCOUNTER — Encounter: Payer: Self-pay | Admitting: Endocrinology

## 2016-12-25 VITALS — BP 118/70 | HR 97 | Ht 61.0 in | Wt 210.6 lb

## 2016-12-25 DIAGNOSIS — E041 Nontoxic single thyroid nodule: Secondary | ICD-10-CM | POA: Diagnosis not present

## 2017-03-13 ENCOUNTER — Encounter: Payer: Self-pay | Admitting: Physician Assistant

## 2017-03-13 ENCOUNTER — Ambulatory Visit (INDEPENDENT_AMBULATORY_CARE_PROVIDER_SITE_OTHER): Payer: Managed Care, Other (non HMO) | Admitting: Physician Assistant

## 2017-03-13 VITALS — BP 110/80 | HR 85 | Temp 98.1°F | Ht 61.0 in | Wt 216.0 lb

## 2017-03-13 DIAGNOSIS — M545 Low back pain, unspecified: Secondary | ICD-10-CM

## 2017-03-13 DIAGNOSIS — N912 Amenorrhea, unspecified: Secondary | ICD-10-CM

## 2017-03-13 LAB — POCT URINALYSIS DIPSTICK
Bilirubin, UA: NEGATIVE
GLUCOSE UA: NEGATIVE
KETONES UA: NEGATIVE
Nitrite, UA: NEGATIVE
Protein, UA: 15
SPEC GRAV UA: 1.01 (ref 1.010–1.025)
Urobilinogen, UA: 0.2 E.U./dL
pH, UA: 8.5 — AB (ref 5.0–8.0)

## 2017-03-13 LAB — HCG, QUANTITATIVE, PREGNANCY: Quantitative HCG: <0.6 m[IU]/mL

## 2017-03-13 LAB — POCT URINE PREGNANCY: PREG TEST UR: NEGATIVE

## 2017-03-13 MED ORDER — CEPHALEXIN 500 MG PO CAPS: 500.0000 mg | ORAL_CAPSULE | Freq: Two times a day (BID) | ORAL | 0 refills | Status: DC
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
## 2017-03-13 NOTE — Patient Instructions (Signed)
It was great to see you!  We will call you with your lab and remaining urine results.   Push fluids and stay hydrated, goal is for urine to be clear.   Try to avoid bed rest and this can make your back pain worse.  If back pain does not improve, of if you develop fevers, chills, or other concerns -- please let us know!

## 2017-03-14 LAB — URINE CULTURE

## 2017-04-01 ENCOUNTER — Encounter: Payer: Self-pay | Admitting: Adult Health

## 2017-04-01 ENCOUNTER — Ambulatory Visit (INDEPENDENT_AMBULATORY_CARE_PROVIDER_SITE_OTHER): Payer: Managed Care, Other (non HMO) | Admitting: Adult Health

## 2017-04-01 DIAGNOSIS — J301 Allergic rhinitis due to pollen: Secondary | ICD-10-CM

## 2017-04-01 DIAGNOSIS — J309 Allergic rhinitis, unspecified: Secondary | ICD-10-CM | POA: Insufficient documentation

## 2017-04-01 DIAGNOSIS — J45909 Unspecified asthma, uncomplicated: Secondary | ICD-10-CM | POA: Diagnosis not present

## 2017-04-01 MED ORDER — BUDESONIDE-FORMOTEROL FUMARATE 160-4.5 MCG/ACT IN AERO: 2.0000 | INHALATION_SPRAY | Freq: Two times a day (BID) | RESPIRATORY_TRACT | 5 refills | Status: DC

## 2017-04-01 NOTE — Assessment & Plan Note (Signed)
Add Zyrtec At bedtime  As needed

## 2017-04-01 NOTE — Assessment & Plan Note (Addendum)
Doing well without flare - pt education on controller meds vs rescue meds.  Asthma action plan .  Control for triggers  Needs PFT .   Plan  Patient Instructions  Continue on Symbicort As needed -restart if asthma flares  Use Albuterol 2 puffs every 4hrs as needed - this is your rescue inhaler  Return for PFT.  Zyrtec 10mg  At bedtime  As needed   Follow up with .Dr. Isaiah SergeMannam in 1 year  and As needed  .

## 2017-04-01 NOTE — Patient Instructions (Addendum)
Continue on Symbicort As needed -restart if asthma flares  Use Albuterol 2 puffs every 4hrs as needed - this is your rescue inhaler  Return for PFT.  Zyrtec 10mg  At bedtime  As needed   Follow up with .Dr. Isaiah SergeMannam in 1 year  and As needed  .
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
## 2017-04-01 NOTE — Addendum Note (Signed)
Addended by: Boone MasterJONES, JESSICA E on: 04/01/2017 11:12 AM   Modules accepted: Orders

## 2017-04-02 ENCOUNTER — Telehealth: Payer: Self-pay
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
## 2017-04-02 NOTE — Telephone Encounter (Signed)
Prior authorization for patient was sent via covermymeds. The key for this PA is [id number]. This was sent to plan with additional information on 04/02/17.   This was approved on 04/02/17. Pharmacy has been notified.

## 2017-04-08 ENCOUNTER — Telehealth: Payer: Self-pay | Admitting: Adult Health

## 2017-04-08 NOTE — Telephone Encounter (Signed)
Spoke with pt, who states even with coapy card for symbicort her copay is 120 dollars. Pt is requesting an alternative. I have advised pt to contact her insurance company for medication formulary, and to contact us once formulary has been received.

## 2017-04-14 NOTE — Telephone Encounter (Signed)
ATC pt, VM is full. WCB.  

## 2017-04-15 NOTE — Telephone Encounter (Signed)
Attempted to contact pt. No answer, I could not leave message due to her voicemail being full. Will try back.

## 2017-04-16 NOTE — Telephone Encounter (Signed)
lmtcb x1 for pt. 

## 2017-04-20 NOTE — Telephone Encounter (Signed)
lmtcb for pt.  

## 2017-04-21 MED ORDER — FLUTICASONE-SALMETEROL 100-50 MCG/DOSE IN AEPB: 1.0000 | INHALATION_SPRAY | Freq: Two times a day (BID) | RESPIRATORY_TRACT | 5 refills | Status: DC

## 2017-04-21 NOTE — Telephone Encounter (Signed)
I have not heard of this - ? Fluticasone  Is the new medication Advair  That is fine to change if it is Advair - 100 1 puff Twice daily  ,rinse after use.

## 2017-04-21 NOTE — Telephone Encounter (Addendum)
ATC pt, no answer. Left message for pt to call back.  Spoke with pt, advised message from TP. Rx sent into [facility] on [address]. Nothing further is needed.

## 2017-04-21 NOTE — Telephone Encounter (Signed)
Patient is returning phone call.  °

## 2017-04-21 NOTE — Telephone Encounter (Signed)
Spoke with patient to verify the medication name. It is indeed called Flutias. It appears to be the same medication as Advair.   TP please advise if you are ok with her switching to Advair. Thanks!

## 2017-04-21 NOTE — Telephone Encounter (Signed)
ATC pt, no answer. Left message for pt to call back.  

## 2017-04-21 NOTE — Telephone Encounter (Signed)
Patient states her insurance company will cover the alternative medication of: Flutias; pt states needs prior authorization for this medication (Flutias)and requesting copay card if possible for the medication (Flutias).pt contact # [phone]

## 2017-04-27 ENCOUNTER — Telehealth: Payer: Self-pay | Admitting: Adult Health

## 2017-04-27 NOTE — Telephone Encounter (Signed)
Patient saw TP on 9.12.18 where she was continued on Symbicort 160 Then the office received phone call from patient stating that the Symbicort was too expensive and an inhaler called Flutias/?Advair was preferred.  Patient was then changed to Advair 100 but now it sounds like Advair is not covered and Symbicort is?  Leigh reported okay to send message to her since she is PAs this afternoon to call [facility] to see if we can figure this out.  Thank you Leigh.  [facility] [phone]

## 2017-04-27 NOTE — Telephone Encounter (Signed)
Received PA form from [email or web address] for the advair 100/50.  This medication is not covered by her insurance but the will cover:  dulera  100 or 200 symbicort 80 or 160  Or we can proceed with the PA.  TP please advise. Thanks   No Known Allergies

## 2017-04-28 NOTE — Telephone Encounter (Signed)
I have attempted to go through the [email or web address] to initiate the PA for the adviar.   Unable to send this in via CMM due to it is not able to find the pt under [place].   It comes back and tells me that the covered alterantives are symbicort and dulera.  It looks as thought the pt had the symbicort before but stated that this was too expensive.  I have called and lmomtcb x 1 for the pt.

## 2017-05-05 NOTE — Telephone Encounter (Signed)
I have called patient regarding medication. Left message for patient to give [place] a call back. I will also send a letter in the mail for the patient to give [place] a call.

## 2017-07-21 NOTE — L&D Delivery Note (Signed)
Delivery Note At 12:42 PM a viable and healthy female was delivered via Vaginal, Spontaneous (Presentation: ROA).  APGAR: 9, 9; weight  pending.   Placenta status: spontaneous, intact.  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  Cord pH: na  Anesthesia:  epidural Episiotomy: None Lacerations: 2nd degree;Perineal Suture Repair: 2.0 vicryl rapide Est. Blood Loss (mL):  300  Mom to postpartum.  Baby to NICU due to EFW < 1900 gms  Aseret Hoffman J 07/07/2018, 12:59 PM

## 2017-09-29 ENCOUNTER — Emergency Department (HOSPITAL_COMMUNITY)
Admission: EM | Admit: 2017-09-29 | Discharge: 2017-09-29 | Disposition: A | Discharge status: Home / Self Care | Payer: Managed Care, Other (non HMO) | Attending: Emergency Medicine | Admitting: Emergency Medicine

## 2017-09-29 ENCOUNTER — Emergency Department (HOSPITAL_COMMUNITY): Payer: Managed Care, Other (non HMO)

## 2017-09-29 ENCOUNTER — Encounter (HOSPITAL_COMMUNITY): Payer: Self-pay | Admitting: Emergency Medicine

## 2017-09-29 DIAGNOSIS — R0789 Other chest pain: Secondary | ICD-10-CM | POA: Diagnosis not present

## 2017-09-29 DIAGNOSIS — Z79899 Other long term (current) drug therapy: Secondary | ICD-10-CM | POA: Insufficient documentation

## 2017-09-29 DIAGNOSIS — J45909 Unspecified asthma, uncomplicated: Secondary | ICD-10-CM | POA: Diagnosis not present

## 2017-09-29 LAB — BASIC METABOLIC PANEL
ANION GAP: 10 (ref 5–15)
BUN: 16 mg/dL (ref 6–20)
CALCIUM: 9.2 mg/dL (ref 8.9–10.3)
CO2: 23 mmol/L (ref 22–32)
CREATININE: 0.72 mg/dL (ref 0.44–1.00)
Chloride: 104 mmol/L (ref 101–111)
GFR calc Af Amer: >60 mL/min (ref >60)
GLUCOSE: 107 mg/dL — AB (ref 65–99)
Potassium: 4 mmol/L (ref 3.5–5.1)
Sodium: 137 mmol/L (ref 135–145)

## 2017-09-29 LAB — I-STAT BETA HCG BLOOD, ED (MC, WL, AP ONLY): I-stat hCG, quantitative: <5 m[IU]/mL (ref <5)

## 2017-09-29 LAB — I-STAT TROPONIN, ED
TROPONIN I, POC: 0 ng/mL (ref 0.00–0.08)
TROPONIN I, POC: 0 ng/mL (ref 0.00–0.08)

## 2017-09-29 LAB — CBC
HCT: 44.7 % (ref 36.0–46.0)
HEMOGLOBIN: 14.3 g/dL (ref 12.0–15.0)
MCH: 27.9 pg (ref 26.0–34.0)
MCHC: 32 g/dL (ref 30.0–36.0)
MCV: 87.1 fL (ref 78.0–100.0)
PLATELETS: 302 10*3/uL (ref 150–400)
RBC: 5.13 MIL/uL — ABNORMAL HIGH (ref 3.87–5.11)
RDW: 14.4 % (ref 11.5–15.5)
WBC: 9.8 10*3/uL (ref 4.0–10.5)

## 2017-09-29 MED ORDER — AEROCHAMBER PLUS FLO-VU MEDIUM MISC
1.0000 | Freq: Once | Status: AC
  Administered 2017-09-29: 1
  Filled 2017-09-29: qty 1

## 2017-09-29 MED ORDER — ALBUTEROL SULFATE HFA 108 (90 BASE) MCG/ACT IN AERS
1.0000 | INHALATION_SPRAY | RESPIRATORY_TRACT | Status: DC | PRN
  Administered 2017-09-29: 2 via RESPIRATORY_TRACT
  Filled 2017-09-29: qty 6.7
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
## 2017-09-29 NOTE — ED Triage Notes (Signed)
Pt arrives with c/o chest pain x1 hour this morning, starting at 4AM and lasting one hour. Resolved at this time.

## 2017-09-29 NOTE — ED Notes (Signed)
Dr. Particia NearingHaviland speaking with pt at bedside

## 2017-10-02 ENCOUNTER — Encounter: Payer: Self-pay | Admitting: Physician Assistant

## 2017-10-02 ENCOUNTER — Ambulatory Visit (INDEPENDENT_AMBULATORY_CARE_PROVIDER_SITE_OTHER): Payer: Managed Care, Other (non HMO) | Admitting: Physician Assistant

## 2017-10-02 VITALS — BP 110/76 | HR 95 | Temp 98.5°F | Ht 61.0 in | Wt 226.0 lb

## 2017-10-02 DIAGNOSIS — J45909 Unspecified asthma, uncomplicated: Secondary | ICD-10-CM | POA: Diagnosis not present
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
## 2017-10-02 NOTE — Patient Instructions (Addendum)
It was great to see you!  Start Advair OR Symbicort TWICE DAILY.  If this does not improve your symptoms, please contact pulmonology.

## 2018-01-11 ENCOUNTER — Encounter: Payer: Self-pay | Admitting: Physician Assistant

## 2018-01-11 ENCOUNTER — Ambulatory Visit (INDEPENDENT_AMBULATORY_CARE_PROVIDER_SITE_OTHER): Payer: Managed Care, Other (non HMO) | Admitting: Physician Assistant

## 2018-01-11 VITALS — BP 120/82 | HR 92 | Temp 98.4°F | Ht 61.0 in | Wt 222.5 lb

## 2018-01-11 DIAGNOSIS — M549 Dorsalgia, unspecified: Secondary | ICD-10-CM | POA: Insufficient documentation

## 2018-01-11 DIAGNOSIS — T148XXA Other injury of unspecified body region, initial encounter: Secondary | ICD-10-CM

## 2018-01-11 DIAGNOSIS — E079 Disorder of thyroid, unspecified: Secondary | ICD-10-CM | POA: Insufficient documentation

## 2018-01-11 DIAGNOSIS — K219 Gastro-esophageal reflux disease without esophagitis: Secondary | ICD-10-CM | POA: Insufficient documentation

## 2018-01-11 DIAGNOSIS — S99912A Unspecified injury of left ankle, initial encounter: Secondary | ICD-10-CM | POA: Diagnosis not present

## 2018-01-11 DIAGNOSIS — Z3491 Encounter for supervision of normal pregnancy, unspecified, first trimester: Secondary | ICD-10-CM | POA: Diagnosis not present
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
## 2018-01-11 NOTE — Patient Instructions (Signed)
It was great to see you!   Continue to protect your ankle. May use cool water to submerge your ankle in. Keep elevated.  If your symptoms do not improve or if they worsen, please return to our office to see Dr. Berline Choughigby here in our office for further evaluation and treatment.

## 2018-01-20 ENCOUNTER — Encounter: Payer: Self-pay | Admitting: Sports Medicine

## 2018-01-20 ENCOUNTER — Ambulatory Visit (INDEPENDENT_AMBULATORY_CARE_PROVIDER_SITE_OTHER): Payer: Managed Care, Other (non HMO) | Admitting: Sports Medicine

## 2018-01-20 VITALS — BP 110/84 | HR 107 | Ht 61.0 in | Wt 224.2 lb

## 2018-01-20 DIAGNOSIS — S93492A Sprain of other ligament of left ankle, initial encounter: Secondary | ICD-10-CM | POA: Diagnosis not present

## 2018-01-20 NOTE — Progress Notes (Signed)
PROCEDURE NOTE: THERAPEUTIC EXERCISES (97110) 15 minutes spent for Therapeutic exercises as below and as referenced in the AVS.  This included exercises focusing on stretching, strengthening, with significant focus on eccentric aspects.   Proper technique shown and discussed handout in great detail with ATC.  All questions were discussed and answered.   Long term goals include an improvement in range of motion, strength, endurance as well as avoiding reinjury. Frequency of visits is one time as determined during today's  office visit. Frequency of exercises to be performed is as per handout.  EXERCISES REVIEWED: 4 Way Ankle exercises 1 footed balance

## 2018-01-20 NOTE — Progress Notes (Signed)
Veverly FellsMichael D. Delorise Shinerigby, DO  [facility] [phone]  Jessica Hooper [place] - 32 y.o. female MRN [id number]  Date of birth: Oct 28, 1985  Visit Date: 01/20/2018  PCP: Jarold MottoWorley, Samantha, PA   Referred by: Jarold MottoWorley, Samantha, [place]  Scribe(s) for today's visit: Stevenson ClinchBrandy Coleman, CMA  SUBJECTIVE:  Jessica Hooper Holck is here for Initial Assessment (L ankle pain) Referred by Jarold MottoSamantha Worley, NP  Her L ankle pain symptoms INITIALLY: Began about 1-2 weeks ago and started after she fell and rolled her ankle, she is not sure if the was inversion or eversion. She landed on her R knee.  Described as mild-moderate sharp pain, aching, nonradiating Worsened with weight-bearing. Improved with RICE Additional associated symptoms include: Pain is lateral. She has noticed swelling around her ankle.   At this time symptoms are worsening compared to onset, pain is worse.  She has been wearing ACE wrap daily, she does not sleep in ACE wrap. Initially she was icing her ankle but now she has been soaking it in cold water. She has tried taking Tylenol prn with some relief.   Currently pregnant.    REVIEW OF SYSTEMS: Reports night time disturbances. Denies fevers, chills, or night sweats. Denies unexplained weight loss. Denies personal history of cancer. Denies changes in bowel or bladder habits. Reports recent unreported falls. Reports new or worsening dyspnea or wheezing. Denies headaches or dizziness.  Denies numbness, tingling or weakness  In the extremities.  Denies dizziness or presyncopal episodes Reports lower extremity edema    HISTORY & PERTINENT PRIOR DATA:  Prior History reviewed and updated per electronic medical record.  Significant/pertinent history, findings, studies include:  reports that she has never smoked. She has never used smokeless tobacco. No results for input(s): HGBA1C, LABURIC, CREATINE in the last 8760 hours. No specialty comments  available. No problems updated.  OBJECTIVE:  VS:  HT:5\' 1"  (154.9 cm)   WT:224 lb 3.2 oz (101.7 kg)  BMI:42.38    BP:110/84  HR:(Abnormal) 107bpm  TEMP: ( )  RESP:97 %   PHYSICAL EXAM: Constitutional: WDWN, Non-toxic appearing. Psychiatric: Alert & appropriately interactive.  Not depressed or anxious appearing. Respiratory: No increased work of breathing.  Trachea Midline Eyes: Pupils are equal.  EOM intact without nystagmus.  No scleral icterus  Vascular Exam: warm to touch no edema  lower extremity neuro exam: unremarkable normal strength normal sensation  MSK Exam: Left ankle is overall well aligned.  She does have some small bruising and swelling of the mainly anterior lateral ankle.  Mild TTP over the lateral malleolus more focally over the anterior aspect.  She has some pain with drawer testing and slight laxity.  Mild pain with talar tilt.  Intrinsic ankle strength is intact and no significant pain over the peroneal tendons.   ASSESSMENT & PLAN:   1. Sprain of anterior talofibular ligament of left ankle, initial encounter     PLAN: Discussed the foundation of treatment for this condition is physical therapy and/or daily (5-6 days/week) therapeutic exercises, focusing on core strengthening, coordination, neuromuscular control/reeducation.  Therapeutic exercises prescribed per procedure note.  Lace up ankle brace provided today.  X-rays deferred given lack of auto ankle rules and consistent with ankle sprain if any lack of improvement or persistent worsening further diagnostic evaluation with plain film x-rays will be obtained at follow-up.  Follow-up: Return in about 1 month (around 02/17/2018), or if symptoms worsen or fail to improve, for repeat clinical exam.  Please see additional documentation for Objective, Assessment and Plan sections. Pertinent additional documentation may be included in corresponding procedure notes, imaging studies, problem based  documentation and patient instructions. Please see these sections of the encounter for additional information regarding this visit.  CMA/ATC served as Education administrator during this visit. History, Physical, and Plan performed by medical provider. Documentation and orders reviewed and attested to.      Gerda Diss, [facility] Physician

## 2018-01-20 NOTE — Patient Instructions (Addendum)
Please perform the exercise program that we have prepared for you and gone over in detail on a daily basis.  In addition to the handout you were provided you can access your program through: [email or web address]   Your unique program code is: [email or web address]

## 2018-01-31 ENCOUNTER — Encounter: Payer: Self-pay | Admitting: Sports Medicine

## 2018-02-22 ENCOUNTER — Ambulatory Visit: Payer: Managed Care, Other (non HMO) | Admitting: Sports Medicine

## 2018-02-23 ENCOUNTER — Encounter: Payer: Self-pay | Admitting: Sports Medicine

## 2018-02-23 ENCOUNTER — Ambulatory Visit (INDEPENDENT_AMBULATORY_CARE_PROVIDER_SITE_OTHER): Payer: Managed Care, Other (non HMO) | Admitting: Sports Medicine

## 2018-02-23 VITALS — BP 110/78 | HR 115 | Ht 61.0 in | Wt 224.6 lb

## 2018-02-23 DIAGNOSIS — S99912A Unspecified injury of left ankle, initial encounter: Secondary | ICD-10-CM | POA: Diagnosis not present

## 2018-02-23 DIAGNOSIS — Z3491 Encounter for supervision of normal pregnancy, unspecified, first trimester: Secondary | ICD-10-CM

## 2018-02-23 DIAGNOSIS — S93492A Sprain of other ligament of left ankle, initial encounter: Secondary | ICD-10-CM

## 2018-02-23 NOTE — Progress Notes (Signed)
Veverly FellsMichael D. Delorise Shinerigby, DO  [facility] [phone]  Jessica Cecilmanda Steffek - 32 y.o. female MRN [id number]  Date of birth: 03/16/1986  Visit Date: 02/23/2018  PCP: Jarold MottoWorley, Samantha, PA   Referred by: Jarold MottoWorley, Samantha, [place]  Scribe(s) for today's visit: Christoper FabianMolly Weber, LAT, ATC  SUBJECTIVE:  Jessica Hooper is here for Follow-up (L ankle sprain) .    Notes from Initial Visit on 01/20/18: Her L ankle pain symptoms INITIALLY: Began about 1-2 weeks ago and started after she fell and rolled her ankle, she is not sure if the was inversion or eversion. She landed on her R knee.  Described as mild-moderate sharp pain, aching, nonradiating Worsened with weight-bearing. Improved with RICE Additional associated symptoms include: Pain is lateral. She has noticed swelling around her ankle.   At this time symptoms are worsening compared to onset, pain is worse.  She has been wearing ACE wrap daily, she does not sleep in ACE wrap. Initially she was icing her ankle but now she has been soaking it in cold water. She has tried taking Tylenol prn with some relief.   Currently pregnant.   02/23/2018: Compared to the last office visit on 01/20/18, her previously described L ankle pain symptoms are improving with a near 90% improvement. Current symptoms are mild & are nonradiating She has been doing her HEP regularly and wearing a lace-up ankle brace when she walks for exercise.   REVIEW OF SYSTEMS: Denies night time disturbances. Denies fevers, chills, or night sweats. Denies unexplained weight loss. Denies personal history of cancer. Denies changes in bowel or bladder habits. Denies recent unreported falls. Reports new or worsening dyspnea or wheezing. Denies headaches or dizziness.  Denies numbness, tingling or weakness  In the extremities.  Denies dizziness or presyncopal episodes Reports lower extremity edema - mild L ankle swelling   HISTORY & PERTINENT  PRIOR DATA:  Prior History reviewed and updated per electronic medical record.  Significant/pertinent history, findings, studies include:  reports that she has never smoked. She has never used smokeless tobacco. No results for input(s): HGBA1C, LABURIC, CREATINE in the last 8760 hours. No specialty comments available. No problems updated.  OBJECTIVE:  VS:  HT:5\' 1"  (154.9 cm)   WT:224 lb 9.6 oz (101.9 kg)  BMI:42.46    BP:110/78  HR:(Abnormal) 115bpm  TEMP: ( )  RESP:97 %   PHYSICAL EXAM: Constitutional: WDWN, Non-toxic appearing. Psychiatric: Alert & appropriately interactive.  Not depressed or anxious appearing. Respiratory: No increased work of breathing.  Trachea Midline Eyes: Pupils are equal.  EOM intact without nystagmus.  No scleral icterus  Vascular Exam: warm to touch  lower extremity neuro exam: unremarkable  MSK Exam: Left ankle is overall well.  Ligamentously stable.  Good dorsiflexion, plantarflexion and inversion, eversion strength.  Ankle drawer testing is stable.  He has a small amount of edema over the anterior lateral ankle and distal leg but this is minimal.  No pitting edema.   PROCEDURES & DATA REVIEWED:    ASSESSMENT & PLAN:   1. Sprain of anterior talofibular ligament of left ankle, initial encounter   2. Injury of left ankle, initial encounter   3. Pregnant and not yet delivered in first trimester     PLAN: L.  She is 17 weeks into pregnancy and denies any lower extremity swelling this time but did caution her that this may occur as she progresses throughout there may be worsening unilateral swelling of the left ankle due  to this recent injury.  Okay to continue with compression as needed and therapeutic exercises as previously prescribed.   Follow-up: Return if symptoms worsen or fail to improve.      Please see additional documentation for Objective, Assessment and Plan sections. Pertinent additional documentation may be included in  corresponding procedure notes, imaging studies, problem based documentation and patient instructions. Please see these sections of the encounter for additional information regarding this visit.  CMA/ATC served as Neurosurgeon during this visit. History, Physical, and Plan performed by medical provider. Documentation and orders reviewed and attested to.      Andrena Mews, DO    [facility] Physician

## 2018-05-19 ENCOUNTER — Other Ambulatory Visit: Payer: Self-pay

## 2018-05-19 ENCOUNTER — Inpatient Hospital Stay (HOSPITAL_COMMUNITY)
Admission: AD | Admit: 2018-05-19 | Discharge: 2018-05-19 | Disposition: A | Discharge status: Home / Self Care | Payer: Managed Care, Other (non HMO) | Source: Ambulatory Visit | Attending: Obstetrics | Admitting: Obstetrics

## 2018-05-19 ENCOUNTER — Encounter (HOSPITAL_COMMUNITY): Payer: Self-pay

## 2018-05-19 DIAGNOSIS — G44209 Tension-type headache, unspecified, not intractable: Secondary | ICD-10-CM

## 2018-05-19 DIAGNOSIS — K219 Gastro-esophageal reflux disease without esophagitis: Secondary | ICD-10-CM | POA: Diagnosis not present

## 2018-05-19 DIAGNOSIS — O99613 Diseases of the digestive system complicating pregnancy, third trimester: Secondary | ICD-10-CM | POA: Insufficient documentation

## 2018-05-19 DIAGNOSIS — E079 Disorder of thyroid, unspecified: Secondary | ICD-10-CM | POA: Insufficient documentation

## 2018-05-19 DIAGNOSIS — Z3A29 29 weeks gestation of pregnancy: Secondary | ICD-10-CM | POA: Insufficient documentation

## 2018-05-19 DIAGNOSIS — O9989 Other specified diseases and conditions complicating pregnancy, childbirth and the puerperium: Secondary | ICD-10-CM

## 2018-05-19 DIAGNOSIS — Z7951 Long term (current) use of inhaled steroids: Secondary | ICD-10-CM | POA: Diagnosis not present

## 2018-05-19 DIAGNOSIS — O99283 Endocrine, nutritional and metabolic diseases complicating pregnancy, third trimester: Secondary | ICD-10-CM | POA: Diagnosis not present

## 2018-05-19 DIAGNOSIS — R51 Headache: Secondary | ICD-10-CM | POA: Diagnosis present

## 2018-05-19 DIAGNOSIS — O26893 Other specified pregnancy related conditions, third trimester: Secondary | ICD-10-CM | POA: Diagnosis not present

## 2018-05-19 DIAGNOSIS — Z79899 Other long term (current) drug therapy: Secondary | ICD-10-CM | POA: Diagnosis not present

## 2018-05-19 LAB — URINALYSIS, ROUTINE W REFLEX MICROSCOPIC
Bilirubin Urine: NEGATIVE
Glucose, UA: NEGATIVE mg/dL
Hgb urine dipstick: NEGATIVE
Ketones, ur: 80 mg/dL — AB
NITRITE: NEGATIVE
PROTEIN: 100 mg/dL — AB
SPECIFIC GRAVITY, URINE: 1.034 — AB (ref 1.005–1.030)
WBC, UA: >50 WBC/hpf — ABNORMAL HIGH (ref 0–5)
pH: 5 (ref 5.0–8.0)

## 2018-05-19 NOTE — Discharge Instructions (Signed)
General Headache Without Cause A headache is pain or discomfort felt around the head or neck area. The specific cause of a headache may not be found. There are many causes and types of headaches. A few common ones are:  Tension headaches.  Migraine headaches.  Cluster headaches.  Chronic daily headaches.  Follow these instructions at home: Watch your condition for any changes. Take these steps to help with your condition: Managing pain  Take over-the-counter and prescription medicines only as told by your health care provider.  Lie down in a dark, quiet room when you have a headache.  If directed, apply ice to the head and neck area: ? Put ice in a plastic bag. ? Place a towel between your skin and the bag. ? Leave the ice on for 20 minutes, 2-3 times per day.  Use a heating pad or hot shower to apply heat to the head and neck area as told by your health care provider.  Keep lights dim if bright lights bother you or make your headaches worse. Eating and drinking  Eat meals on a regular schedule.  Limit alcohol use.  Decrease the amount of caffeine you drink, or stop drinking caffeine. General instructions  Keep all follow-up visits as told by your health care provider. This is important.  Keep a headache journal to help find out what may trigger your headaches. For example, write down: ? What you eat and drink. ? How much sleep you get. ? Any change to your diet or medicines.  Try massage or other relaxation techniques.  Limit stress.  Sit up straight, and do not tense your muscles.  Do not use tobacco products, including cigarettes, chewing tobacco, or e-cigarettes. If you need help quitting, ask your health care provider.  Exercise regularly as told by your health care provider.  Sleep on a regular schedule. Get 7-9 hours of sleep, or the amount recommended by your health care provider. Contact a health care provider if:  Your symptoms are not helped by  medicine.  You have a headache that is different from the usual headache.  You have nausea or you vomit.  You have a fever. Get help right away if:  Your headache becomes severe.  You have repeated vomiting.  You have a stiff neck.  You have a loss of vision.  You have problems with speech.  You have pain in the eye or ear.  You have muscular weakness or loss of muscle control.  You lose your balance or have trouble walking.  You feel faint or pass out.  You have confusion. This information is not intended to replace advice given to you by your health care provider. Make sure you discuss any questions you have with your health care provider. Document Released: 07/07/2005 Document Revised: 12/13/2015 Document Reviewed: 10/30/2014 Elsevier Interactive Patient Education  2018 [employer]  

## 2018-05-19 NOTE — MAU Note (Signed)
Pt here with c/o headache that started at midnight; took 2 extra-strength Tylenol then and it's starting to help now. Denies any bleeding or leaking. Reports positive fetal movement.
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
## 2018-06-02 ENCOUNTER — Encounter: Payer: Managed Care, Other (non HMO) | Attending: Obstetrics and Gynecology | Admitting: Registered"

## 2018-06-02 DIAGNOSIS — Z713 Dietary counseling and surveillance: Secondary | ICD-10-CM | POA: Insufficient documentation

## 2018-06-02 DIAGNOSIS — O99810 Abnormal glucose complicating pregnancy: Secondary | ICD-10-CM

## 2018-06-02 DIAGNOSIS — O24419 Gestational diabetes mellitus in pregnancy, unspecified control: Secondary | ICD-10-CM | POA: Insufficient documentation

## 2018-06-04 ENCOUNTER — Encounter: Payer: Self-pay | Admitting: Registered"

## 2018-06-04 DIAGNOSIS — O99810 Abnormal glucose complicating pregnancy: Secondary | ICD-10-CM | POA: Insufficient documentation

## 2018-06-04 NOTE — Progress Notes (Signed)
Patient was seen on 06/02/18 for Gestational Diabetes self-management class at the [facility]. The following learning objectives were met by the patient during this course:   States the definition of Gestational Diabetes  States why dietary management is important in controlling blood glucose  Describes the effects each nutrient has on blood glucose levels  Demonstrates ability to create a balanced meal plan  Demonstrates carbohydrate counting   States when to check blood glucose levels  Demonstrates proper blood glucose monitoring techniques  States the effect of stress and exercise on blood glucose levels  States the importance of limiting caffeine and abstaining from alcohol and smoking  Blood glucose monitor given: none   Patient instructed to monitor glucose levels: FBS: 60 - <95; 1 hour: <140; 2 hour: <120  Patient received handouts:  Nutrition Diabetes and Pregnancy, including carb counting list  Patient will be seen for follow-up as needed.

## 2018-07-07 ENCOUNTER — Inpatient Hospital Stay (HOSPITAL_COMMUNITY): Payer: Managed Care, Other (non HMO) | Admitting: Anesthesiology

## 2018-07-07 ENCOUNTER — Encounter (HOSPITAL_COMMUNITY): Payer: Self-pay

## 2018-07-07 ENCOUNTER — Other Ambulatory Visit: Payer: Self-pay

## 2018-07-07 ENCOUNTER — Inpatient Hospital Stay (HOSPITAL_COMMUNITY)
Admission: AD | Admit: 2018-07-07 | Discharge: 2018-07-10 | DRG: 807 | Disposition: A | Discharge status: Home / Self Care | Payer: Managed Care, Other (non HMO) | Attending: Obstetrics and Gynecology | Admitting: Obstetrics and Gynecology

## 2018-07-07 DIAGNOSIS — O9952 Diseases of the respiratory system complicating childbirth: Secondary | ICD-10-CM | POA: Diagnosis present

## 2018-07-07 DIAGNOSIS — J45909 Unspecified asthma, uncomplicated: Secondary | ICD-10-CM | POA: Diagnosis present

## 2018-07-07 DIAGNOSIS — O1494 Unspecified pre-eclampsia, complicating childbirth: Principal | ICD-10-CM | POA: Diagnosis present

## 2018-07-07 DIAGNOSIS — O365930 Maternal care for other known or suspected poor fetal growth, third trimester, not applicable or unspecified: Secondary | ICD-10-CM | POA: Diagnosis present

## 2018-07-07 DIAGNOSIS — Z3A36 36 weeks gestation of pregnancy: Secondary | ICD-10-CM | POA: Diagnosis not present

## 2018-07-07 DIAGNOSIS — O1493 Unspecified pre-eclampsia, third trimester: Secondary | ICD-10-CM

## 2018-07-07 DIAGNOSIS — O99214 Obesity complicating childbirth: Secondary | ICD-10-CM | POA: Diagnosis present

## 2018-07-07 DIAGNOSIS — O1490 Unspecified pre-eclampsia, unspecified trimester: Secondary | ICD-10-CM | POA: Diagnosis present

## 2018-07-07 DIAGNOSIS — O479 False labor, unspecified: Secondary | ICD-10-CM

## 2018-07-07 HISTORY — DX: Headache, unspecified: R51.9

## 2018-07-07 HISTORY — DX: Unspecified lack of expected normal physiological development in childhood: R62.50

## 2018-07-07 HISTORY — DX: Short stature (child): R62.52

## 2018-07-07 HISTORY — DX: Headache: R51

## 2018-07-07 HISTORY — DX: Essential (primary) hypertension: I10

## 2018-07-07 LAB — COMPREHENSIVE METABOLIC PANEL
ALBUMIN: 2.5 g/dL — AB (ref 3.5–5.0)
ALT: 38 U/L (ref 0–44)
AST: 37 U/L (ref 15–41)
Alkaline Phosphatase: 86 U/L (ref 38–126)
Anion gap: 6 (ref 5–15)
BUN: 20 mg/dL (ref 6–20)
CHLORIDE: 108 mmol/L (ref 98–111)
CO2: 21 mmol/L — AB (ref 22–32)
Calcium: 8.2 mg/dL — ABNORMAL LOW (ref 8.9–10.3)
Creatinine, Ser: 0.74 mg/dL (ref 0.44–1.00)
GFR calc Af Amer: >60 mL/min (ref >60)
GFR calc non Af Amer: >60 mL/min (ref >60)
GLUCOSE: 83 mg/dL (ref 70–99)
POTASSIUM: 4.2 mmol/L (ref 3.5–5.1)
Sodium: 135 mmol/L (ref 135–145)
Total Bilirubin: 0.4 mg/dL (ref 0.3–1.2)
Total Protein: 5.8 g/dL — ABNORMAL LOW (ref 6.5–8.1)

## 2018-07-07 LAB — CBC
HCT: 39.8 % (ref 36.0–46.0)
HCT: 42.3 % (ref 36.0–46.0)
Hemoglobin: 12.6 g/dL (ref 12.0–15.0)
Hemoglobin: 13.6 g/dL (ref 12.0–15.0)
MCH: 27.5 pg (ref 26.0–34.0)
MCH: 27.7 pg (ref 26.0–34.0)
MCHC: 31.7 g/dL (ref 30.0–36.0)
MCHC: 32.2 g/dL (ref 30.0–36.0)
MCV: 86.9 fL (ref 80.0–100.0)
MCV: 86.2 fL (ref 80.0–100.0)
NRBC: 0 % (ref 0.0–0.2)
PLATELETS: 217 10*3/uL (ref 150–400)
Platelets: 224 10*3/uL (ref 150–400)
RBC: 4.58 MIL/uL (ref 3.87–5.11)
RBC: 4.91 MIL/uL (ref 3.87–5.11)
RDW: 15 % (ref 11.5–15.5)
RDW: 15.2 % (ref 11.5–15.5)
WBC: 14.9 10*3/uL — AB (ref 4.0–10.5)
WBC: 24.7 10*3/uL — ABNORMAL HIGH (ref 4.0–10.5)
nRBC: 0 % (ref 0.0–0.2)

## 2018-07-07 LAB — RPR: RPR Ser Ql: NONREACTIVE

## 2018-07-07 LAB — TYPE AND SCREEN
ABO/RH(D): O POS
Antibody Screen: NEGATIVE

## 2018-07-07 LAB — PROTEIN / CREATININE RATIO, URINE
Creatinine, Urine: 85 mg/dL
Protein Creatinine Ratio: 8.62 mg/mg{Cre} — ABNORMAL HIGH (ref 0.00–0.15)
Total Protein, Urine: 733 mg/dL

## 2018-07-07 MED ORDER — OXYCODONE-ACETAMINOPHEN 5-325 MG PO TABS: 1.0000 | ORAL_TABLET | ORAL | Status: DC | PRN

## 2018-07-07 MED ORDER — PHENYLEPHRINE 40 MCG/ML (10ML) SYRINGE FOR IV PUSH (FOR BLOOD PRESSURE SUPPORT)
80.0000 ug | PREFILLED_SYRINGE | INTRAVENOUS | Status: DC | PRN
  Filled 2018-07-07: qty 10

## 2018-07-07 MED ORDER — COCONUT OIL OIL
1.0000 "application " | TOPICAL_OIL | Status: DC | PRN
  Administered 2018-07-08: 1 via TOPICAL
  Filled 2018-07-07: qty 120

## 2018-07-07 MED ORDER — DIPHENHYDRAMINE HCL 50 MG/ML IJ SOLN: 12.5000 mg | INTRAMUSCULAR | Status: DC | PRN

## 2018-07-07 MED ORDER — ACETAMINOPHEN 500 MG PO TABS
1000.0000 mg | ORAL_TABLET | Freq: Once | ORAL | Status: AC
  Administered 2018-07-07: 1000 mg via ORAL
  Filled 2018-07-07: qty 2

## 2018-07-07 MED ORDER — METHYLERGONOVINE MALEATE 0.2 MG PO TABS: 0.2000 mg | ORAL_TABLET | ORAL | Status: DC | PRN

## 2018-07-07 MED ORDER — PENICILLIN G 3 MILLION UNITS IVPB - SIMPLE MED
3.0000 10*6.[IU] | INTRAVENOUS | Status: DC
  Administered 2018-07-07: 3 10*6.[IU] via INTRAVENOUS
  Filled 2018-07-07 (×4): qty 100

## 2018-07-07 MED ORDER — ACETAMINOPHEN 325 MG PO TABS
650.0000 mg | ORAL_TABLET | ORAL | Status: DC | PRN
  Administered 2018-07-07 – 2018-07-09 (×6): 650 mg via ORAL
  Filled 2018-07-07 (×6): qty 2

## 2018-07-07 MED ORDER — WITCH HAZEL-GLYCERIN EX PADS: 1.0000 "application " | MEDICATED_PAD | CUTANEOUS | Status: DC | PRN

## 2018-07-07 MED ORDER — TERBUTALINE SULFATE 1 MG/ML IJ SOLN: 0.2500 mg | Freq: Once | INTRAMUSCULAR | Status: DC | PRN

## 2018-07-07 MED ORDER — LACTATED RINGERS IV SOLN
INTRAVENOUS | Status: DC
  Administered 2018-07-07 – 2018-07-08 (×2): via INTRAVENOUS

## 2018-07-07 MED ORDER — SIMETHICONE 80 MG PO CHEW
80.0000 mg | CHEWABLE_TABLET | ORAL | Status: DC | PRN
  Administered 2018-07-08 – 2018-07-09 (×2): 80 mg via ORAL
  Filled 2018-07-07 (×3): qty 1

## 2018-07-07 MED ORDER — IBUPROFEN 600 MG PO TABS
600.0000 mg | ORAL_TABLET | Freq: Four times a day (QID) | ORAL | Status: DC
  Administered 2018-07-07 – 2018-07-10 (×13): 600 mg via ORAL
  Filled 2018-07-07 (×13): qty 1

## 2018-07-07 MED ORDER — LABETALOL HCL 5 MG/ML IV SOLN: 80.0000 mg | INTRAVENOUS | Status: DC | PRN

## 2018-07-07 MED ORDER — LABETALOL HCL 5 MG/ML IV SOLN: 40.0000 mg | INTRAVENOUS | Status: DC | PRN

## 2018-07-07 MED ORDER — LACTATED RINGERS IV SOLN: 500.0000 mL | Freq: Once | INTRAVENOUS | Status: DC

## 2018-07-07 MED ORDER — LIDOCAINE HCL (PF) 1 % IJ SOLN
30.0000 mL | INTRAMUSCULAR | Status: DC | PRN
  Filled 2018-07-07: qty 30

## 2018-07-07 MED ORDER — OXYTOCIN 40 UNITS IN LACTATED RINGERS INFUSION - SIMPLE MED: 2.5000 [IU]/h | INTRAVENOUS | Status: DC

## 2018-07-07 MED ORDER — EPHEDRINE 5 MG/ML INJ
10.0000 mg | INTRAVENOUS | Status: DC | PRN
  Filled 2018-07-07: qty 2

## 2018-07-07 MED ORDER — DIPHENHYDRAMINE HCL 25 MG PO CAPS: 25.0000 mg | ORAL_CAPSULE | Freq: Four times a day (QID) | ORAL | Status: DC | PRN

## 2018-07-07 MED ORDER — PHENYLEPHRINE 40 MCG/ML (10ML) SYRINGE FOR IV PUSH (FOR BLOOD PRESSURE SUPPORT)
80.0000 ug | PREFILLED_SYRINGE | INTRAVENOUS | Status: DC | PRN
  Filled 2018-07-07 (×2): qty 10

## 2018-07-07 MED ORDER — FENTANYL 2.5 MCG/ML BUPIVACAINE 1/10 % EPIDURAL INFUSION (WH - ANES)
14.0000 mL/h | INTRAMUSCULAR | Status: DC | PRN
  Administered 2018-07-07: 11 mL/h via EPIDURAL
  Filled 2018-07-07: qty 100

## 2018-07-07 MED ORDER — OXYCODONE-ACETAMINOPHEN 5-325 MG PO TABS
1.0000 | ORAL_TABLET | ORAL | Status: DC | PRN
  Administered 2018-07-08: 1 via ORAL
  Filled 2018-07-07: qty 1

## 2018-07-07 MED ORDER — OXYTOCIN 40 UNITS IN LACTATED RINGERS INFUSION - SIMPLE MED
1.0000 m[IU]/min | INTRAVENOUS | Status: DC
  Administered 2018-07-07 (×2): 2 m[IU]/min via INTRAVENOUS
  Filled 2018-07-07: qty 1000

## 2018-07-07 MED ORDER — MAGNESIUM SULFATE BOLUS VIA INFUSION
4.0000 g | Freq: Once | INTRAVENOUS | Status: AC
  Administered 2018-07-07: 4 g via INTRAVENOUS
  Filled 2018-07-07: qty 500

## 2018-07-07 MED ORDER — OXYTOCIN BOLUS FROM INFUSION
500.0000 mL | Freq: Once | INTRAVENOUS | Status: AC
  Administered 2018-07-07: 500 mL via INTRAVENOUS

## 2018-07-07 MED ORDER — TETANUS-DIPHTH-ACELL PERTUSSIS 5-2.5-18.5 LF-MCG/0.5 IM SUSP: 0.5000 mL | Freq: Once | INTRAMUSCULAR | Status: DC

## 2018-07-07 MED ORDER — METHYLERGONOVINE MALEATE 0.2 MG/ML IJ SOLN: 0.2000 mg | INTRAMUSCULAR | Status: DC | PRN

## 2018-07-07 MED ORDER — ONDANSETRON HCL 4 MG/2ML IJ SOLN: 4.0000 mg | Freq: Four times a day (QID) | INTRAMUSCULAR | Status: DC | PRN

## 2018-07-07 MED ORDER — HYDRALAZINE HCL 20 MG/ML IJ SOLN: 10.0000 mg | INTRAMUSCULAR | Status: DC | PRN

## 2018-07-07 MED ORDER — LIDOCAINE HCL (PF) 1 % IJ SOLN
INTRAMUSCULAR | Status: DC | PRN
  Administered 2018-07-07 (×2): 4 mL via EPIDURAL

## 2018-07-07 MED ORDER — LACTATED RINGERS IV SOLN: 500.0000 mL | INTRAVENOUS | Status: DC | PRN

## 2018-07-07 MED ORDER — LACTATED RINGERS AMNIOINFUSION
INTRAVENOUS | Status: DC
  Administered 2018-07-07: 11:00:00 via INTRAUTERINE
  Filled 2018-07-07 (×2): qty 1000

## 2018-07-07 MED ORDER — LABETALOL HCL 5 MG/ML IV SOLN
20.0000 mg | INTRAVENOUS | Status: DC | PRN
  Administered 2018-07-07 (×2): 20 mg via INTRAVENOUS
  Filled 2018-07-07 (×2): qty 4

## 2018-07-07 MED ORDER — SENNOSIDES-DOCUSATE SODIUM 8.6-50 MG PO TABS
2.0000 | ORAL_TABLET | ORAL | Status: DC
  Administered 2018-07-07 – 2018-07-08 (×2): 2 via ORAL
  Filled 2018-07-07 (×2): qty 2

## 2018-07-07 MED ORDER — DIBUCAINE 1 % RE OINT: 1.0000 "application " | TOPICAL_OINTMENT | RECTAL | Status: DC | PRN

## 2018-07-07 MED ORDER — PRENATAL MULTIVITAMIN CH
1.0000 | ORAL_TABLET | Freq: Every day | ORAL | Status: DC
  Administered 2018-07-08 – 2018-07-10 (×3): 1 via ORAL
  Filled 2018-07-07 (×4): qty 1

## 2018-07-07 MED ORDER — ONDANSETRON HCL 4 MG/2ML IJ SOLN: 4.0000 mg | INTRAMUSCULAR | Status: DC | PRN

## 2018-07-07 MED ORDER — ONDANSETRON HCL 4 MG PO TABS: 4.0000 mg | ORAL_TABLET | ORAL | Status: DC | PRN

## 2018-07-07 MED ORDER — SODIUM CHLORIDE 0.9 % IV SOLN
5.0000 10*6.[IU] | Freq: Once | INTRAVENOUS | Status: AC
  Administered 2018-07-07: 5 10*6.[IU] via INTRAVENOUS
  Filled 2018-07-07: qty 5

## 2018-07-07 MED ORDER — ACETAMINOPHEN 325 MG PO TABS: 650.0000 mg | ORAL_TABLET | ORAL | Status: DC | PRN

## 2018-07-07 MED ORDER — LACTATED RINGERS IV SOLN
INTRAVENOUS | Status: DC
  Administered 2018-07-07 (×2): via INTRAVENOUS

## 2018-07-07 MED ORDER — MAGNESIUM SULFATE 40 G IN LACTATED RINGERS - SIMPLE
2.0000 g/h | INTRAVENOUS | Status: DC
  Administered 2018-07-07 – 2018-07-08 (×2): 2 g/h via INTRAVENOUS
  Filled 2018-07-07 (×2): qty 500

## 2018-07-07 MED ORDER — OXYCODONE-ACETAMINOPHEN 5-325 MG PO TABS
2.0000 | ORAL_TABLET | ORAL | Status: DC | PRN
  Administered 2018-07-08: 2 via ORAL
  Filled 2018-07-07: qty 2

## 2018-07-07 MED ORDER — ZOLPIDEM TARTRATE 5 MG PO TABS: 5.0000 mg | ORAL_TABLET | Freq: Every evening | ORAL | Status: DC | PRN

## 2018-07-07 MED ORDER — OXYCODONE-ACETAMINOPHEN 5-325 MG PO TABS: 2.0000 | ORAL_TABLET | ORAL | Status: DC | PRN

## 2018-07-07 MED ORDER — BENZOCAINE-MENTHOL 20-0.5 % EX AERO
1.0000 "application " | INHALATION_SPRAY | CUTANEOUS | Status: DC | PRN
  Administered 2018-07-07: 1 via TOPICAL
  Filled 2018-07-07: qty 56

## 2018-07-07 MED ORDER — SOD CITRATE-CITRIC ACID 500-334 MG/5ML PO SOLN: 30.0000 mL | ORAL | Status: DC | PRN

## 2018-07-07 NOTE — Anesthesia Procedure Notes (Signed)
Epidural Patient location during procedure: OB Start time: 07/07/2018 8:55 AM End time: 07/07/2018 9:03 AM  Staffing Anesthesiologist: Mal AmabileFoster, Zenon Leaf, MD Performed: anesthesiologist   Preanesthetic Checklist Completed: patient identified, site marked, surgical consent, pre-op evaluation, timeout performed, IV checked, risks and benefits discussed and monitors and equipment checked  Epidural Patient position: sitting Prep: site prepped and draped and DuraPrep Patient monitoring: continuous pulse ox and blood pressure Approach: midline Location: L3-L4 Injection technique: LOR air  Needle:  Needle type: Tuohy  Needle gauge: 17 G Needle length: 9 cm and 9 Needle insertion depth: 7 cm Catheter type: closed end flexible Catheter size: 19 Gauge Catheter at skin depth: 13 cm Test dose: negative and Other  Assessment Events: blood not aspirated, injection not painful, no injection resistance, negative IV test and no paresthesia  Additional Notes Patient identified. Risks and benefits discussed including failed block, incomplete  Pain control, post dural puncture headache, nerve damage, paralysis, blood pressure Changes, nausea, vomiting, reactions to medications-both toxic and allergic and post Partum back pain. All questions were answered. Patient expressed understanding and wished to proceed. Sterile technique was used throughout procedure. Epidural site was Dressed with sterile barrier dressing. No paresthesias, signs of intravascular injection Or signs of intrathecal spread were encountered.  Patient was more comfortable after the epidural was dosed. Please see RN's note for documentation of vital signs and FHR which are stable. Reason for block:procedure for pain
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
## 2018-07-07 NOTE — Anesthesia Preprocedure Evaluation (Signed)
Anesthesia Evaluation  Patient identified by MRN, date of birth, ID band Patient awake    Reviewed: Allergy & Precautions, Patient's Chart, lab work & pertinent test results  Airway Mallampati: III  TM Distance: >3 FB Neck ROM: Full    Dental no notable dental hx. (+) Teeth Intact   Pulmonary asthma ,    Pulmonary exam normal breath sounds clear to auscultation       Cardiovascular hypertension, Normal cardiovascular exam Rhythm:Regular Rate:Normal     Neuro/Psych  Headaches, negative psych ROS   GI/Hepatic Neg liver ROS, GERD  Medicated,  Endo/Other  Morbid obesity  Renal/GU negative Renal ROS  negative genitourinary   Musculoskeletal Chronic low back pain   Abdominal (+) + obese,   Peds  Hematology   Anesthesia Other Findings   Reproductive/Obstetrics (+) Pregnancy                             Anesthesia Physical Anesthesia Plan  ASA: III  Anesthesia Plan: Epidural   Post-op Pain Management:    Induction:   PONV Risk Score and Plan:   Airway Management Planned: Natural Airway  Additional Equipment:   Intra-op Plan:   Post-operative Plan:   Informed Consent: I have reviewed the patients History and Physical, chart, labs and discussed the procedure including the risks, benefits and alternatives for the proposed anesthesia with the patient or authorized representative who has indicated his/her understanding and acceptance.     Plan Discussed with: Anesthesiologist  Anesthesia Plan Comments:         Anesthesia Quick Evaluation

## 2018-07-07 NOTE — Progress Notes (Signed)
Patient with severe range B/P's: MD notified.  Per MD, RN to give 20 mg of IV push labetalol and then patient to get an epidural.  MD instructed RN not to continue the hypertensive protocol.

## 2018-07-07 NOTE — Anesthesia Postprocedure Evaluation (Signed)
Anesthesia Post Note  Patient: Jessica Hooper  Procedure(s) Performed: AN AD HOC LABOR EPIDURAL     Patient location during evaluation: Mother Baby Anesthesia Type: Epidural Level of consciousness: awake and alert and oriented Pain management: satisfactory to patient Vital Signs Assessment: post-procedure vital signs reviewed and stable Respiratory status: respiratory function stable Cardiovascular status: stable Postop Assessment: no headache, no backache, epidural receding, patient able to bend at knees, no signs of nausea or vomiting and adequate PO intake Anesthetic complications: no    Last Vitals:  Vitals:   07/07/18 1748 07/07/18 1832  BP: (!) 153/89 122/80  Pulse: 86 85  Resp: 19 18  Temp:    SpO2: 98% 98%    Last Pain:  Vitals:   07/07/18 1748  TempSrc:   PainSc: 3    Pain Goal:                 [name]

## 2018-07-07 NOTE — Progress Notes (Signed)
RN made MD aware of B/P still in severe range after 20 mg of IV push labetalol. MD request no more labetalol to be given at this time and RN to reassess B/P in 30 min.

## 2018-07-07 NOTE — MAU Note (Signed)
Pt with cxts starting between 1:00a-1:30a today. Pain 3/10. Pt noticed bright red vaginal bldg around 215a today. Last IC this past weekend. Dec FM. No watery leakage.  Adah Perlhandra Jermond Burkemper RN
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
## 2018-07-07 NOTE — Anesthesia Pain Management Evaluation Note (Signed)
  CRNA Pain Management Visit Note  Patient: Jessica Hooper, 32 y.o., female  "Hello I am a member of the anesthesia team at [facility]. We have an anesthesia team available at all times to provide care throughout the hospital, including epidural management and anesthesia for C-section. I don't know your plan for the delivery whether it a natural birth, water birth, IV sedation, nitrous supplementation, doula or epidural, but we want to meet your pain goals."   1.Was your pain managed to your expectations on prior hospitalizations?   Yes   2.What is your expectation for pain management during this hospitalization?     Epidural and Spinal for repeat C-section  3.How can we help you reach that goal? Epidural  Record the patient's initial score and the patient's pain goal.   Pain: 0  Pain Goal: 5 [facility] wants you to be able to say your pain was always managed very well.  Cleda ClarksBrowder, Terius Jacuinde R 07/07/2018

## 2018-07-07 NOTE — Progress Notes (Signed)
Patient ID: Jessica Hooper, female   DOB: April 06, 1986, 32 y.o.   MRN: [id number] CTSP to assess FHT 120s/ repetitive variable decels down to 70s- cat II Toco q 2 min Pitocin was at 2 mu and turned off by RN Position changes done Exam 4/90%/ -4. Asynclitic IUPC placed, RN had placed FSE  Keep pitocin off Amnioinfusion 300 bolus and 150 ml /hr rate after Position High Folwer, bind belly or support with towel wedge to prevent baby from going in oblique lie doe to pendulous abdomen and high station  Dr Billy Coastaavon informed  -Jessica Mikami MD

## 2018-07-08 LAB — CBC
HCT: 34.3 % — ABNORMAL LOW (ref 36.0–46.0)
Hemoglobin: 11.1 g/dL — ABNORMAL LOW (ref 12.0–15.0)
MCH: 27.8 pg (ref 26.0–34.0)
MCHC: 32.4 g/dL (ref 30.0–36.0)
MCV: 85.8 fL (ref 80.0–100.0)
Platelets: 191 10*3/uL (ref 150–400)
RBC: 4 MIL/uL (ref 3.87–5.11)
RDW: 15.3 % (ref 11.5–15.5)
WBC: 16.1 10*3/uL — AB (ref 4.0–10.5)
nRBC: 0 % (ref 0.0–0.2)

## 2018-07-08 MED ORDER — NIFEDIPINE ER OSMOTIC RELEASE 30 MG PO TB24
30.0000 mg | ORAL_TABLET | Freq: Every day | ORAL | Status: DC
  Administered 2018-07-08 – 2018-07-10 (×3): 30 mg via ORAL
  Filled 2018-07-08 (×3): qty 1

## 2018-07-08 NOTE — Lactation Note (Signed)
This note was copied from a baby's chart. Lactation Consultation Note  Patient Name: Girl Azucena Cecilmanda Lorusso RUEAV'WToday's Date: 07/08/2018 Reason for consult: Initial assessment;Late-preterm 34-36.6wks;Infant < 6lbs;NICU baby  P2 mother whose infant is now 7521 hours old.  This is a LPTI at 36+0 weeks and weighing < 6 lbs.  The baby is in the NICU.  Mother pumped and bottle fed her first child who is now 772 1/31 years old.  She has no desire to breast feed this baby.  Mother's breasts are soft and non tender and her nipples are flat.  When she compresses her breast tissue her nipples invert.  This will not be an issue due to mother wanting to pump only.  Demonstrated hand expression and had mother do a return demonstration.  She was unable to express any colostrum drops at this time.  Encouraged breast massage and hand expression before/after pumping to help increase milk supply.  Mother will pump 8 times/day and take any EBM she obtains to the NICU.  She stated that her RN is bringing in labels for her.  I provided colostrum containers and reviewed pump set up with her.   Informed mother that she is able to pump at baby's bedside in the NICU if desired.  Discussed using the privacy screens or going into the private pump rooms.  Suggested she do a lot of STS and getting involved in the care of her baby as soon as she is able.  Mother is hoping to be off her magnesium about 1400 and will go to the NICU for the 1500 feeding.  She will call as needed for any questions/concerns.  NICU booklet given and milk storage times reviewed.   Maternal Data Formula Feeding for Exclusion: No Has patient been taught Hand Expression?: Yes Does the patient have breastfeeding experience prior to this delivery?: No(Mother pumped and bottle fed first child)  Feeding Feeding Type: Donor Breast Milk Nipple Type: Nfant Slow Flow (purple)  LATCH Score                   Interventions    Lactation Tools  Discussed/Used Pump Review: Setup, frequency, and cleaning;Milk Storage Initiated by:: Adjusted pump and discussed storage times   Consult Status Consult Status: Follow-up Date: 07/09/18 Follow-up type: In-patient    Carla Rashad R Guillaume Weninger 07/08/2018, 9:53 AM

## 2018-07-08 NOTE — Progress Notes (Signed)
Post Partum Day 1 S/P spontaneous vaginal after IOL for PEC with IUGR  Baby in NICU  Subjective: Intermittent  HA. No SOB, CP, F/C, breast symptoms. Normal vaginal bleeding, no clots. Pain controlled.  ambulating without symptoms. Feels 'woozy' on Mag.   Objective: BP (!) 157/107 (BP Location: Right Arm)   Pulse 86   Temp (!) 97.5 F (36.4 C) (Oral)   Resp 18   Ht 5\' 1"  (1.549 m)   Wt 104.8 kg   LMP 10/28/2017   SpO2 100%   Breastfeeding Unknown   BMI 43.65 kg/m  I&O reviewed.   Vitals:   07/08/18 0416 07/08/18 0500 07/08/18 0600 07/08/18 0833  BP: (!) 145/82   (!) 157/107  Pulse: 72   86  Resp: 18 17 17 18   Temp: (!) 97.3 F (36.3 C)   (!) 97.5 F (36.4 C)  TempSrc:    Oral  SpO2: 98%   100%  Weight:      Height:         Physical Exam:  General: alert, cooperative and obese Lochia: appropriate Uterine Fundus: firm Abd: obese, no RUQ pain DVT Evaluation: No evidence of DVT seen on physical exam. 3+ DTR, no clonus, 2+ edema Ext: No c/c/e Recent Labs    07/07/18 1509 07/08/18 0538  HGB 13.6 11.1*  HCT 42.3 34.3*   CBC Latest Ref Rng & Units 07/08/2018 07/07/2018 07/07/2018  WBC 4.0 - 10.5 K/uL 16.1(H) 24.7(H) 14.9(H)  Hemoglobin 12.0 - 15.0 g/dL 11.1(L) 13.6 12.6  Hematocrit 36.0 - 46.0 % 34.3(L) 42.3 39.8  Platelets 150 - 400 K/uL 191 224 217      Assessment/Plan: 32 y.o.  PPD #1 .  normal postpartum exam Continue current postpartum care Ambulate PEC. Cont mag for 24 hrs then reassess fluid status and consider d/c Mag. Given continued elevated bps, despite Mag, will start Procardia now. If bp's remain elevated despite antihypertensives, consider repeat CMP lab.   LOS: 1 day   Lendon ColonelKelly A Leonell Lobdell 07/08/2018 9:02 AM

## 2018-07-08 NOTE — Progress Notes (Signed)
POSTOPERATIVE DAY # 1 S/P CS - preeclampsia   VS: BP (!) 151/91 (BP Location: Right Arm)   Pulse 88   Temp (!) 97.4 F (36.3 C) (Oral)   Resp 18   Ht 5\' 1"  (1.549 m)   Wt 104.8 kg   LMP 10/28/2017   SpO2 100%   Breastfeeding Unknown   BMI 43.65 kg/m    BP 151/91 - 149/97 - 157/107     I&O: net positive 738   A:        POD # 1 S/P CS            preeclampsia without significant diuresis  P:        routine postoperative care              continue magnesium sulfate & procardia - repeat labs in AM             consider lasix if no significant output overnight    Marlinda Mikeanya Jak Haggar CNM, MSN, [facility] 07/08/2018, 6:58 PM

## 2018-07-09 LAB — COMPREHENSIVE METABOLIC PANEL
ALT: 29 U/L (ref 0–44)
ANION GAP: 7 (ref 5–15)
AST: 21 U/L (ref 15–41)
Albumin: 2.5 g/dL — ABNORMAL LOW (ref 3.5–5.0)
Alkaline Phosphatase: 82 U/L (ref 38–126)
BUN: 10 mg/dL (ref 6–20)
CO2: 23 mmol/L (ref 22–32)
Calcium: 7.5 mg/dL — ABNORMAL LOW (ref 8.9–10.3)
Chloride: 105 mmol/L (ref 98–111)
Creatinine, Ser: 0.61 mg/dL (ref 0.44–1.00)
GFR calc Af Amer: >60 mL/min (ref >60)
GFR calc non Af Amer: >60 mL/min (ref >60)
Glucose, Bld: 73 mg/dL (ref 70–99)
Potassium: 4.3 mmol/L (ref 3.5–5.1)
Sodium: 135 mmol/L (ref 135–145)
Total Bilirubin: <0.1 mg/dL — ABNORMAL LOW (ref 0.3–1.2)
Total Protein: 5.8 g/dL — ABNORMAL LOW (ref 6.5–8.1)

## 2018-07-09 LAB — CBC
HCT: 34.4 % — ABNORMAL LOW (ref 36.0–46.0)
Hemoglobin: 10.8 g/dL — ABNORMAL LOW (ref 12.0–15.0)
MCH: 27.1 pg (ref 26.0–34.0)
MCHC: 31.4 g/dL (ref 30.0–36.0)
MCV: 86.4 fL (ref 80.0–100.0)
Platelets: 207 10*3/uL (ref 150–400)
RBC: 3.98 MIL/uL (ref 3.87–5.11)
RDW: 15.5 % (ref 11.5–15.5)
WBC: 15.3 10*3/uL — ABNORMAL HIGH (ref 4.0–10.5)
nRBC: 0 % (ref 0.0–0.2)

## 2018-07-09 MED ORDER — HYDROCHLOROTHIAZIDE 12.5 MG PO CAPS
12.5000 mg | ORAL_CAPSULE | Freq: Once | ORAL | Status: AC
  Administered 2018-07-10: 12.5 mg via ORAL
  Filled 2018-07-09: qty 1

## 2018-07-09 MED ORDER — HYDROCHLOROTHIAZIDE 25 MG PO TABS
25.0000 mg | ORAL_TABLET | Freq: Once | ORAL | Status: AC
  Administered 2018-07-09: 25 mg via ORAL
  Filled 2018-07-09: qty 1

## 2018-07-09 NOTE — Lactation Note (Signed)
This note was copied from a baby's chart. Lactation Consultation Note  Patient Name: Jessica Hooper WUJWJ'XToday's Date: 07/09/2018 Reason for consult: Follow-up assessment;NICU baby   P2, Baby 45 hours old in NICU.  Mother wants to exclusively pump.  She has personal DEBP at home. Discussed pump conversion, labels and hands on pumping. Encouraged mother to hand express before and after pumping and use her hands to increase her supply during pumping.    Maternal Data    Feeding Feeding Type: Donor Breast Milk Nipple Type: Nfant Slow Flow (purple)  LATCH Score                   Interventions Interventions: DEBP  Lactation Tools Discussed/Used     Consult Status Consult Status: Follow-up Date: 07/10/18 Follow-up type: In-patient    Dahlia ByesBerkelhammer, Dejon Lukas [facility] 07/09/2018, 10:12 AM

## 2018-07-09 NOTE — Progress Notes (Signed)
Postpartum # 2 S/P SVD / preeeclampsia   S:         Reports feeling good - no pain / no PIH symptoms but increased swelling in feet             Tolerating po intake / no nausea / no vomiting              Bleeding is light             Pain controlled with motrin             Up ad lib / ambulatory/ voiding QS  Newborn stable in NICU - starting tube feeding today  O:  VS: BP (!) 136/94 (BP Location: Left Arm)   Pulse 82   Temp 98.4 F (36.9 C) (Oral)   Resp 18   Ht 5\' 1"  (1.549 m)   Wt 104.8 kg   LMP 10/28/2017   SpO2 99%   Breastfeeding Unknown   BMI 43.65 kg/m   BP: 136/94 - 138/99 - 133/85 - 108/76 - 151/91  LABS:               Recent Labs    07/08/18 0538 07/09/18 0541  WBC 16.1* 15.3*  HGB 11.1* 10.8*  PLT 191 207  Results for Azucena CecilROULX, Meerab (MRN [id number]) as of 07/09/2018 07:21  Ref. Range 07/09/2018 05:41  Creatinine Latest Ref Range: 0.44 - 1.00 mg/dL 4.090.61  Calcium Latest Ref Range: 8.9 - 10.3 mg/dL 7.5 (L)  Anion gap Latest Ref Range: 5 - 15  7  Alkaline Phosphatase Latest Ref Range: 38 - 126 U/L 82  Albumin Latest Ref Range: 3.5 - 5.0 g/dL 2.5 (L)  AST Latest Ref Range: 15 - 41 U/L 21  ALT Latest Ref Range: 0 - 44 U/L 29               Bloodtype: --/--/O POS (12/18 0443)  Rubella:    Immune                                        I&O: Intake/Output      12/19 0701 - 12/20 0700 12/20 0701 - 12/21 0700   P.O. 1440    I.V. (mL/kg) 625 (6)    Total Intake(mL/kg) 2065 (19.7)    Urine (mL/kg/hr) 4300 (1.7)    Blood     Total Output 4300    Net -2235                    Physical Exam:             Alert and Oriented X3  Lungs: Clear and unlabored  Heart: regular rate and rhythm / no mumurs  Abdomen: soft, large panus, non-tender, non-distended              Fundus: firm, non-tender, Ueven           Perineum: no edema  Lochia: light  Extremities: 2+ pitting edema, no calf pain or tenderness  A:        PPD # 2 S/P SVD            Preeclampsia with  dependent edema / stable labs            newborn NICU   P:        routine postoperative care  labile hypertension -procardia 30 XL             start HCTZ             continue I&O              anticipate DC tomorrow     Marlinda Mikeanya Burdette Gergely CNM, MSN, [facility] 07/09/2018, 7:21 AM

## 2018-07-10 MED ORDER — HYDROCHLOROTHIAZIDE 12.5 MG PO CAPS: 12.5000 mg | ORAL_CAPSULE | Freq: Every day | ORAL | 0 refills | Status: DC

## 2018-07-10 MED ORDER — NIFEDIPINE ER OSMOTIC RELEASE 30 MG PO TB24: 60.0000 mg | ORAL_TABLET | Freq: Every day | ORAL | Status: DC

## 2018-07-10 MED ORDER — NIFEDIPINE ER 60 MG PO TB24: 60.0000 mg | ORAL_TABLET | Freq: Every day | ORAL | 2 refills | Status: DC

## 2018-07-10 MED ORDER — IBUPROFEN 600 MG PO TABS: 600.0000 mg | ORAL_TABLET | Freq: Four times a day (QID) | ORAL | 0 refills | Status: DC

## 2018-07-10 NOTE — Discharge Summary (Signed)
Obstetric Discharge Summary  Patient ID: Jessica Hooper MRN: [id number] DOB/AGE: 1986/06/09 32 y.o.   Date of Admission: 07/07/2018  Date of Discharge:  07/10/18  Admitting Diagnosis: Induction of labor at 6868w0d  Secondary Diagnosis: Preeclampsia, IUGR, Asthma  Mode of Delivery: normal spontaneous vaginal delivery     Discharge Diagnosis: No other diagnosis   Intrapartum Procedures: Atificial rupture of membranes, epidural, pitocin augmentation, placement of fetal scalp electrode and placement of intrauterine catheter   Post partum procedures: None  Complications: Second degree perineal laceration, repaired   Brief Hospital Course   Jessica Hooper is a [id number] who had a SVD on 07/07/2018;  for further details of this birth, please refer to the delivery summary. The postpartum course was complicated by labial blood pressures and edema requiring treatment with oral Procardia and HCTZ.  By time of discharge on PPD#3, her pain was controlled on oral pain medications; she had appropriate lochia and was ambulating, voiding without difficulty and tolerating regular diet.  She was deemed stable for discharge to home with close follow up and blood pressure check from home visiting nurse. In office blood pressure check to be scheduled for Thursday, 07/15/2018.  Labs: CBC Latest Ref Rng & Units 07/09/2018 07/08/2018 07/07/2018  WBC 4.0 - 10.5 K/uL 15.3(H) 16.1(H) 24.7(H)  Hemoglobin 12.0 - 15.0 g/dL 10.8(L) 11.1(L) 13.6  Hematocrit 36.0 - 46.0 % 34.4(L) 34.3(L) 42.3  Platelets 150 - 400 K/uL 207 191 224   CMP Latest Ref Rng & Units 07/09/2018 07/07/2018 09/29/2017  Glucose 70 - 99 mg/dL 73 83 147(W107(H)  BUN 6 - 20 mg/dL 10 20 16   Creatinine 0.44 - 1.00 mg/dL 2.950.61 6.210.74 3.080.72  Sodium 135 - 145 mmol/L 135 135 137  Potassium 3.5 - 5.1 mmol/L 4.3 4.2 4.0  Chloride 98 - 111 mmol/L 105 108 104  CO2 22 - 32 mmol/L 23 21(L) 23  Calcium 8.9 - 10.3 mg/dL 7.5(L) 8.2(L) 9.2  Total Protein 6.5 - 8.1  g/dL 6.5(H5.8(L) 8.4(O5.8(L) -  Total Bilirubin 0.3 - 1.2 mg/dL <9.6(E<0.1(L) 0.4 -  Alkaline Phos 38 - 126 U/L 82 86 -  AST 15 - 41 U/L 21 37 -  ALT 0 - 44 U/L 29 38 -   O POS  Physical exam:   Temp:  [97.6 F (36.4 C)-98.6 F (37 C)] 97.6 F (36.4 C) (12/21 1151) Pulse Rate:  [77-100] 91 (12/21 1151) Resp:  [17-18] 18 (12/21 1151) BP: (125-146)/(71-98) 146/98 (12/21 1151) SpO2:  [98 %-100 %] 99 % (12/21 1151)  General: alert and no distress  Lochia: appropriate  Abdomen: soft, NT  Uterine Fundus: firm  Perineum: healing well, no significant drainage, no dehiscence, no significant erythema  Extremities: No evidence of DVT seen on physical exam. Generalized edema present.   Discharge Instructions: Per After Visit Summary.  Activity: Advance as tolerated. Pelvic rest for 6 weeks.  Also refer to After Visit Summary  Diet: Regular  Medications:  Allergies as of 07/10/2018   No Known Allergies     Medication List    TAKE these medications   albuterol 108 (90 Base) MCG/ACT inhaler Commonly known as:  PROVENTIL HFA;VENTOLIN HFA Inhale 2 puffs into the lungs every 6 (six) hours as needed for wheezing or shortness of breath.   budesonide-formoterol 160-4.5 MCG/ACT inhaler Commonly known as:  SYMBICORT Inhale 2 puffs into the lungs 2 (two) times daily.   hydrochlorothiazide 12.5 MG capsule Commonly known as:  MICROZIDE Take 1 capsule (12.5 mg total) by mouth daily.  ibuprofen 600 MG tablet Commonly known as:  ADVIL,MOTRIN Take 1 tablet (600 mg total) by mouth every 6 (six) hours.   NIFEdipine 60 MG 24 hr tablet Commonly known as:  ADALAT CC Take 1 tablet (60 mg total) by mouth daily. Start taking on:  July 11, 2018   ondansetron 8 MG disintegrating tablet Commonly known as:  ZOFRAN-ODT Take 8 mg by mouth as needed for nausea or vomiting.   PRENATAL VITAMIN PO Take 1 tablet by mouth daily.   ranitidine 150 MG tablet Commonly known as:  ZANTAC Take 150 mg by  mouth 2 (two) times daily.   TYLENOL 500 MG tablet Generic drug:  acetaminophen Take 1,000 mg by mouth every 6 (six) hours as needed for moderate pain.      Outpatient follow up:  Follow-up Information    Olivia Mackieaavon, Richard, MD. Schedule an appointment as soon as possible for a visit in 5 day(s).   Specialty:  Obstetrics and Gynecology Why:  Please call office to schedule a blood pressure check on Thursday. Contact home visiting RN to check blood pressure after arriving home.  Contact information: [address] Severe[address] [place] [id number] [phone]          Discharged Condition: stable  Discharged to: home   Newborn Data:  Disposition:NICU  Apgars: APGAR (1 MIN): 9   APGAR (5 MINS): 9    Baby Feeding: Breast   Gunnar BullaJenkins Michelle Meleny Tregoning, CNM [facility] 07/10/18 1:59 PM

## 2018-07-10 NOTE — Lactation Note (Signed)
This note was copied from a baby's chart. Lactation Consultation Note; Mom reports she is pumping q 3 hours but not obtaining much milk. Encouragement given. Mom plans to pump and bottle feed- does not want to put baby to the breast. She did this with her first baby. Has Medela pump for home. Reviewed taking pump pieces home and can bring them when visiting baby and pump while here. No questions at present. Reviewed our phone number to call with questions/concerns.   Patient Name: Girl Azucena Cecilmanda Chumley OZDGU'YToday's Date: 07/10/2018 Reason for consult: Follow-up assessment;NICU baby   Maternal Data Formula Feeding for Exclusion: Yes Reason for exclusion: Mother's choice to formula and breast feed on admission;Admission to Intensive Care Unit (ICU) post-partum Has patient been taught Hand Expression?: Yes Does the patient have breastfeeding experience prior to this delivery?: Yes  Feeding Feeding Type: Donor Breast Milk Nipple Type: Nfant Slow Flow (purple)  LATCH Score                   Interventions    Lactation Tools Discussed/Used WIC Program: No   Consult Status Consult Status: Complete    Pamelia HoitWeeks, Edmond Ginsberg D 07/10/2018, 8:49 AM

## 2018-07-10 NOTE — Discharge Instructions (Signed)
Breast Pumping Tips Breast pumping is a way to get milk out of your breasts. You will then store the milk for your baby to use when you are away from home. There are three ways to pump. You can:  Use your hand to massage and squeeze your breast (hand expression).  Use a hand-held machine to manually pump your milk.  Use an electric machine to pump your milk. In the beginning you may not get much milk. After a few days your breasts should make more. Pumping can help you start making milk after your baby is born. Pumping helps you to keep making milk when you are away from your baby. When should I pump? You can start pumping soon after your baby is born. Follow these tips:  When you are with your baby: ? Pump after you breastfeed. ? Pump from the free breast while you breastfeed.  When you are away from your baby: ? Pump every 2-3 hours for 15 minutes. ? Pump both breasts at the same time if you can.  If your baby drinks formula, pump around the time your baby gets the formula.  If you drank alcohol, wait 2 hours before you pump.  If you are going to have surgery, ask your doctor when you should pump again. How do I get ready to pump? Take steps to relax. Try these things to help your milk come in:  Smell your baby's blanket or clothes.  Look at a picture or video of your baby.  Sit in a quiet, private space.  Massage your breast and nipple.  Place a cloth on your breast. The cloth should be warm and a little wet.  Play relaxing music.  Picture your milk flowing. What are some tips? General tips for pumping breast milk  Always wash your hands before pumping.  If you do not get much milk or if pumping hurts, try different pump settings or a different kind of pump.  Drink enough fluid so your pee (urine) is clear or pale yellow.  Wear clothing that opens in the front or is easy to take off.  Pump milk into a clean bottle or container.  Do not use anything that has  nicotine or tobacco. Examples are cigarettes and e-cigarettes. If you need help quitting, ask your doctor. Tips for storing breast milk  Store breast milk in a clean, BPA-free container. These include: ? A glass or plastic bottle. ? A milk storage bag.  Store only 2-4 ounces of breast milk in each container.  Swirl the breast milk in the container. Do not shake it.  Write down the date you pumped the milk on the container.  This is how long you can store breast milk: ? Room temperature: 6-8 hours. It is best to use the milk within 4 hours. ? Cooler with ice packs: 24 hours. ? Refrigerator: 5-8 days, if the milk is clean. It is best to use the milk within 3 days. ? Freezer: 9-12 months, if the milk is clean and stored away from the freezer door. It is best to use the milk within 6 months.  Put milk in the back of the refrigerator or freezer.  Thaw frozen milk using warm water. Do not use the microwave. Tips for choosing a breast pump When choosing a pump, keep the following things in mind:  Manual breast pumps do not need electricity. They cost less. They can be hard to use.  Electric breast pumps use electricity. They are more  expensive. They are easier to use. They collect more milk.  The suction cup (flange) should be the right size.  Before you buy the pump, check if your insurance will pay for it. Tips for caring for a breast pump  Check the manual that came with your pump for cleaning tips.  Clean the pump after you use it. To do this: 1. Wipe down the electrical part. Use a dry cloth or paper towel. Do not put this part in water or in cleaning products. 2. Wash the plastic parts with soap and warm water. Or use the dishwasher if the manual says it is safe. You do not need to clean the tubing unless it touched breast milk. 3. Let all the parts air dry. Avoid drying them with a cloth or towel. 4. When the parts are clean and dry, put the pump back together. Then store the  pump.  If there is water in the tubing when you want to pump: 1. Attach the tubing to the pump. 2. Turn on the pump. 3. Turn off the pump when the tube is dry.  Try not to touch the inside of pump parts. Summary  Pumping can help you start making milk after your baby is born. It lets you keep making milk when you are away from your baby.  When you are away from your baby, pump for about 15 minutes every 2-3 hours. Pump both breasts at the same time, if you can. This information is not intended to replace advice given to you by your health care provider. Make sure you discuss any questions you have with your health care provider. Document Released: 12/24/2007 Document Revised: 08/11/2016 Document Reviewed: 08/11/2016 Elsevier Interactive Patient Education  2019 [employer] Postpartum Care After Vaginal Delivery This sheet gives you information about how to care for yourself from the time you deliver your baby to up to 6-12 weeks after delivery (postpartum period). Your health care provider may also give you more specific instructions. If you have problems or questions, contact your health care provider. Follow these instructions at home: Vaginal bleeding  It is normal to have vaginal bleeding (lochia) after delivery. Wear a sanitary pad for vaginal bleeding and discharge. ? During the first week after delivery, the amount and appearance of lochia is often similar to a menstrual period. ? Over the next few weeks, it will gradually decrease to a dry, yellow-brown discharge. ? For most women, lochia stops completely by 4-6 weeks after delivery. Vaginal bleeding can vary from woman to woman.  Change your sanitary pads frequently. Watch for any changes in your flow, such as: ? A sudden increase in volume. ? A change in color. ? Large blood clots.  If you pass a blood clot from your vagina, save it and call your health care provider to discuss. Do not flush blood clots down the toilet  before talking with your health care provider.  Do not use tampons or douches until your health care provider says this is safe.  If you are not breastfeeding, your period should return 6-8 weeks after delivery. If you are feeding your child breast milk only (exclusive breastfeeding), your period may not return until you stop breastfeeding. Perineal care  Keep the area between the vagina and the anus (perineum) clean and dry as told by your health care provider. Use medicated pads and pain-relieving sprays and creams as directed.  If you had a cut in the perineum (episiotomy) or a tear in the vagina,  check the area for signs of infection until you are healed. Check for: ? More redness, swelling, or pain. ? Fluid or blood coming from the cut or tear. ? Warmth. ? Pus or a bad smell.  You may be given a squirt bottle to use instead of wiping to clean the perineum area after you go to the bathroom. As you start healing, you may use the squirt bottle before wiping yourself. Make sure to wipe gently.  To relieve pain caused by an episiotomy, a tear in the vagina, or swollen veins in the anus (hemorrhoids), try taking a warm sitz bath 2-3 times a day. A sitz bath is a warm water bath that is taken while you are sitting down. The water should only come up to your hips and should cover your buttocks. Breast care  Within the first few days after delivery, your breasts may feel heavy, full, and uncomfortable (breast engorgement). Milk may also leak from your breasts. Your health care provider can suggest ways to help relieve the discomfort. Breast engorgement should go away within a few days.  If you are breastfeeding: ? Wear a bra that supports your breasts and fits you well. ? Keep your nipples clean and dry. Apply creams and ointments as told by your health care provider. ? You may need to use breast pads to absorb milk that leaks from your breasts. ? You may have uterine contractions every time  you breastfeed for up to several weeks after delivery. Uterine contractions help your uterus return to its normal size. ? If you have any problems with breastfeeding, work with your health care provider or Advertising copywriter.  If you are not breastfeeding: ? Avoid touching your breasts a lot. Doing this can make your breasts produce more milk. ? Wear a good-fitting bra and use cold packs to help with swelling. ? Do not squeeze out (express) milk. This causes you to make more milk. Intimacy and sexuality  Ask your health care provider when you can engage in sexual activity. This may depend on: ? Your risk of infection. ? How fast you are healing. ? Your comfort and desire to engage in sexual activity.  You are able to get pregnant after delivery, even if you have not had your period. If desired, talk with your health care provider about methods of birth control (contraception). Medicines  Take over-the-counter and prescription medicines only as told by your health care provider.  If you were prescribed an antibiotic medicine, take it as told by your health care provider. Do not stop taking the antibiotic even if you start to feel better. Activity  Gradually return to your normal activities as told by your health care provider. Ask your health care provider what activities are safe for you.  Rest as much as possible. Try to rest or take a nap while your baby is sleeping. Eating and drinking   Drink enough fluid to keep your urine pale yellow.  Eat high-fiber foods every day. These may help prevent or relieve constipation. High-fiber foods include: ? Whole grain cereals and breads. ? Brown rice. ? Beans. ? Fresh fruits and vegetables.  Do not try to lose weight quickly by cutting back on calories.  Take your prenatal vitamins until your postpartum checkup or until your health care provider tells you it is okay to stop. Lifestyle  Do not use any products that contain nicotine  or tobacco, such as cigarettes and e-cigarettes. If you need help quitting, ask your health  care provider.  Do not drink alcohol, especially if you are breastfeeding. General instructions  Keep all follow-up visits for you and your baby as told by your health care provider. Most women visit their health care provider for a postpartum checkup within the first 3-6 weeks after delivery. Contact a health care provider if:  You feel unable to cope with the changes that your child brings to your life, and these feelings do not go away.  You feel unusually sad or worried.  Your breasts become red, painful, or hard.  You have a fever.  You have trouble holding urine or keeping urine from leaking.  You have little or no interest in activities you used to enjoy.  You have not breastfed at all and you have not had a menstrual period for 12 weeks after delivery.  You have stopped breastfeeding and you have not had a menstrual period for 12 weeks after you stopped breastfeeding.  You have questions about caring for yourself or your baby.  You pass a blood clot from your vagina. Get help right away if:  You have chest pain.  You have difficulty breathing.  You have sudden, severe leg pain.  You have severe pain or cramping in your lower abdomen.  You bleed from your vagina so much that you fill more than one sanitary pad in one hour. Bleeding should not be heavier than your heaviest period.  You develop a severe headache.  You faint.  You have blurred vision or spots in your vision.  You have bad-smelling vaginal discharge.  You have thoughts about hurting yourself or your baby. If you ever feel like you may hurt yourself or others, or have thoughts about taking your own life, get help right away. You can go to the nearest emergency department or call:  Your local emergency services (911 in the [place]  A suicide crisis helpline, such as the National Suicide Prevention Lifeline  at (785)323-71791-626-312-6920. This is open 24 hours a day. Summary  The period of time right after you deliver your newborn up to 6-12 weeks after delivery is called the postpartum period.  Gradually return to your normal activities as told by your health care provider.  Keep all follow-up visits for you and your baby as told by your health care provider. This information is not intended to replace advice given to you by your health care provider. Make sure you discuss any questions you have with your health care provider. Document Released: 05/04/2007 Document Revised: 04/20/2017 Document Reviewed: 04/20/2017 Elsevier Interactive Patient Education  2019 [employer] Postpartum Baby Blues The postpartum period begins right after the birth of a baby. During this time, there is often a lot of joy and excitement. It is also a time of many changes in the life of the parents. No matter how many times a mother gives birth, each child brings new challenges to the family, including different ways of relating to one another. It is common to have feelings of excitement along with confusing changes in moods, emotions, and thoughts. You may feel happy one minute and sad or stressed the next. These feelings of sadness usually happen in the period right after you have your baby, and they go away within a week or two. This is called the "baby blues." What are the causes? There is no known cause of baby blues. It is likely caused by a combination of factors. However, changes in hormone levels after childbirth are believed to trigger some of  the symptoms. Other factors that can play a role in these mood changes include:  Lack of sleep.  Stressful life events, such as poverty, caring for a loved one, or death of a loved one.  Genetics. What are the signs or symptoms? Symptoms of this condition include:  Brief changes in mood, such as going from extreme happiness to sadness.  Decreased concentration.  Difficulty  sleeping.  Crying spells and tearfulness.  Loss of appetite.  Irritability.  Anxiety. If the symptoms of baby blues last for more than 2 weeks or become more severe, you may have postpartum depression. How is this diagnosed? This condition is diagnosed based on an evaluation of your symptoms. There are no medical or lab tests that lead to a diagnosis, but there are various questionnaires that a health care provider may use to identify women with the baby blues or postpartum depression. How is this treated? Treatment is not needed for this condition. The baby blues usually go away on their own in 1-2 weeks. Social support is often all that is needed. You will be encouraged to get adequate sleep and rest. Follow these instructions at home: Lifestyle      Get as much rest as you can. Take a nap when the baby sleeps.  Exercise regularly as told by your health care provider. Some women find yoga and walking to be helpful.  Eat a balanced and nourishing diet. This includes plenty of fruits and vegetables, whole grains, and lean proteins.  Do little things that you enjoy. Have a cup of tea, take a bubble bath, read your favorite magazine, or listen to your favorite music.  Avoid alcohol.  Ask for help with household chores, cooking, grocery shopping, or running errands. Do not try to do everything yourself. Consider hiring a postpartum doula to help. This is a professional who specializes in providing support to new mothers.  Try not to make any major life changes during pregnancy or right after giving birth. This can add stress. General instructions  Talk to people close to you about how you are feeling. Get support from your partner, family members, friends, or other new moms. You may want to join a support group.  Find ways to cope with stress. This may include: ? Writing your thoughts and feelings in a journal. ? Spending time outside. ? Spending time with people who make you  laugh.  Try to stay positive in how you think. Think about the things you are grateful for.  Take over-the-counter and prescription medicines only as told by your health care provider.  Let your health care provider know if you have any concerns.  Keep all postpartum visits as told by your health care provider. This is important. Contact a health care provider if:  Your baby blues do not go away after 2 weeks. Get help right away if:  You have thoughts of taking your own life (suicidal thoughts).  You think you may harm the baby or other people.  You see or hear things that are not there (hallucinations). Summary  After giving birth, you may feel happy one minute and sad or stressed the next. Feelings of sadness that happen right after the baby is born and go away after a week or two are called the "baby blues."  You can manage the baby blues by getting enough rest, eating a healthy diet, exercising, spending time with supportive people, and finding ways to cope with stress.  If feelings of sadness and stress  last longer than 2 weeks or get in the way of caring for your baby, talk to your health care provider. This may mean you have postpartum depression. This information is not intended to replace advice given to you by your health care provider. Make sure you discuss any questions you have with your health care provider. Document Released: 04/10/2004 Document Revised: 09/02/2016 Document Reviewed: 09/02/2016 Elsevier Interactive Patient Education  2019 [employer] Preeclampsia and Eclampsia  Preeclampsia is a serious condition that may develop during pregnancy. It is also called toxemia of pregnancy. This condition causes high blood pressure along with other symptoms, such as swelling and headaches. These symptoms may develop as the condition gets worse. Preeclampsia may occur at 20 weeks of pregnancy or later. Diagnosing and treating preeclampsia early is very important. If not  treated early, it can cause serious problems for you and your baby. One problem it can lead to is eclampsia. Eclampsia is a condition that causes muscle jerking or shaking (convulsions or seizures) and other serious problems for the mother. During pregnancy, delivering your baby may be the best treatment for preeclampsia or eclampsia. For most women, preeclampsia and eclampsia symptoms go away after giving birth. In rare cases, a woman may develop preeclampsia after giving birth (postpartum preeclampsia). This usually occurs within 48 hours after childbirth but may occur up to 6 weeks after giving birth. What are the causes? The cause of preeclampsia is not known. What increases the risk? The following risk factors make you more likely to develop preeclampsia:  Being pregnant for the first time.  Having had preeclampsia during a past pregnancy.  Having a family history of preeclampsia.  Having high blood pressure.  Being pregnant with more than one baby.  Being 55 or older.  Being African-American.  Having kidney disease or diabetes.  Having medical conditions such as lupus or blood diseases.  Being very overweight (obese). What are the signs or symptoms? The earliest signs of preeclampsia are:  High blood pressure.  Increased protein in your urine. Your health care provider will check for this at every visit before you give birth (prenatal visit). Other symptoms that may develop as the condition gets worse include:  Severe headaches.  Sudden weight gain.  Swelling of the hands, face, legs, and feet.  Nausea and vomiting.  Vision problems, such as blurred or double vision.  Numbness in the face, arms, legs, and feet.  Urinating less than usual.  Dizziness.  Slurred speech.  Abdominal pain, especially upper abdominal pain.  Convulsions or seizures. How is this diagnosed? There are no screening tests for preeclampsia. Your health care provider will ask you about  symptoms and check for signs of preeclampsia during your prenatal visits. You may also have tests that include:  Urine tests.  Blood tests.  Checking your blood pressure.  Monitoring your babys heart rate.  Ultrasound. How is this treated? You and your health care provider will determine the treatment approach that is best for you. Treatment may include:  Having more frequent prenatal exams to check for signs of preeclampsia, if you have an increased risk for preeclampsia.  Medicine to lower your blood pressure.  Staying in the hospital, if your condition is severe. There, treatment will focus on controlling your blood pressure and the amount of fluids in your body (fluid retention).  Taking medicine (magnesium sulfate) to prevent seizures. This may be given as an injection or through an IV.  Taking a low-dose aspirin during your pregnancy.  Delivering your baby early, if your condition gets worse. You may have your labor started with medicine (induced), or you may have a cesarean delivery. Follow these instructions at home: Eating and drinking   Drink enough fluid to keep your urine pale yellow.  Avoid caffeine. Lifestyle  Do not use any products that contain nicotine or tobacco, such as cigarettes and e-cigarettes. If you need help quitting, ask your health care provider.  Do not use alcohol or drugs.  Avoid stress as much as possible. Rest and get plenty of sleep. General instructions  Take over-the-counter and prescription medicines only as told by your health care provider.  When lying down, lie on your left side. This keeps pressure off your major blood vessels.  When sitting or lying down, raise (elevate) your feet. Try putting some pillows underneath your lower legs.  Exercise regularly. Ask your health care provider what kinds of exercise are best for you.  Keep all follow-up and prenatal visits as told by your health care provider. This is important. How  is this prevented? There is no known way of preventing preeclampsia or eclampsia from developing. However, to lower your risk of complications and detect problems early:  Get regular prenatal care. Your health care provider may be able to diagnose and treat the condition early.  Maintain a healthy weight. Ask your health care provider for help managing weight gain during pregnancy.  Work with your health care provider to manage any long-term (chronic) health conditions you have, such as diabetes or kidney problems.  You may have tests of your blood pressure and kidney function after giving birth.  Your health care provider may have you take low-dose aspirin during your next pregnancy. Contact a health care provider if:  You have symptoms that your health care provider told you may require more treatment or monitoring, such as: ? Headaches. ? Nausea or vomiting. ? Abdominal pain. ? Dizziness. ? Light-headedness. Get help right away if:  You have severe: ? Abdominal pain. ? Headaches that do not get better. ? Dizziness. ? Vision problems. ? Confusion. ? Nausea or vomiting.  You have any of the following: ? A seizure. ? Sudden, rapid weight gain. ? Sudden swelling in your hands, ankles, or face. ? Trouble moving any part of your body. ? Numbness in any part of your body. ? Trouble speaking. ? Abnormal bleeding.  You faint. Summary  Preeclampsia is a serious condition that may develop during pregnancy. It is also called toxemia of pregnancy.  This condition causes high blood pressure along with other symptoms, such as swelling and headaches.  Diagnosing and treating preeclampsia early is very important. If not treated early, it can cause serious problems for you and your baby.  Get help right away if you have symptoms that your health care provider told you to watch for. This information is not intended to replace advice given to you by your health care provider. Make  sure you discuss any questions you have with your health care provider. Document Released: 07/04/2000 Document Revised: 06/23/2017 Document Reviewed: 02/11/2016 Elsevier Interactive Patient Education  2019 [employer].

## 2018-07-28 IMAGING — DX DG CHEST 2V
2 series · 2 of 2 positions shown · non-contrast
Comparison: None.

CLINICAL DATA: Acute onset of generalized chest pain.

EXAM:
CHEST - 2 VIEW

[chest pa]
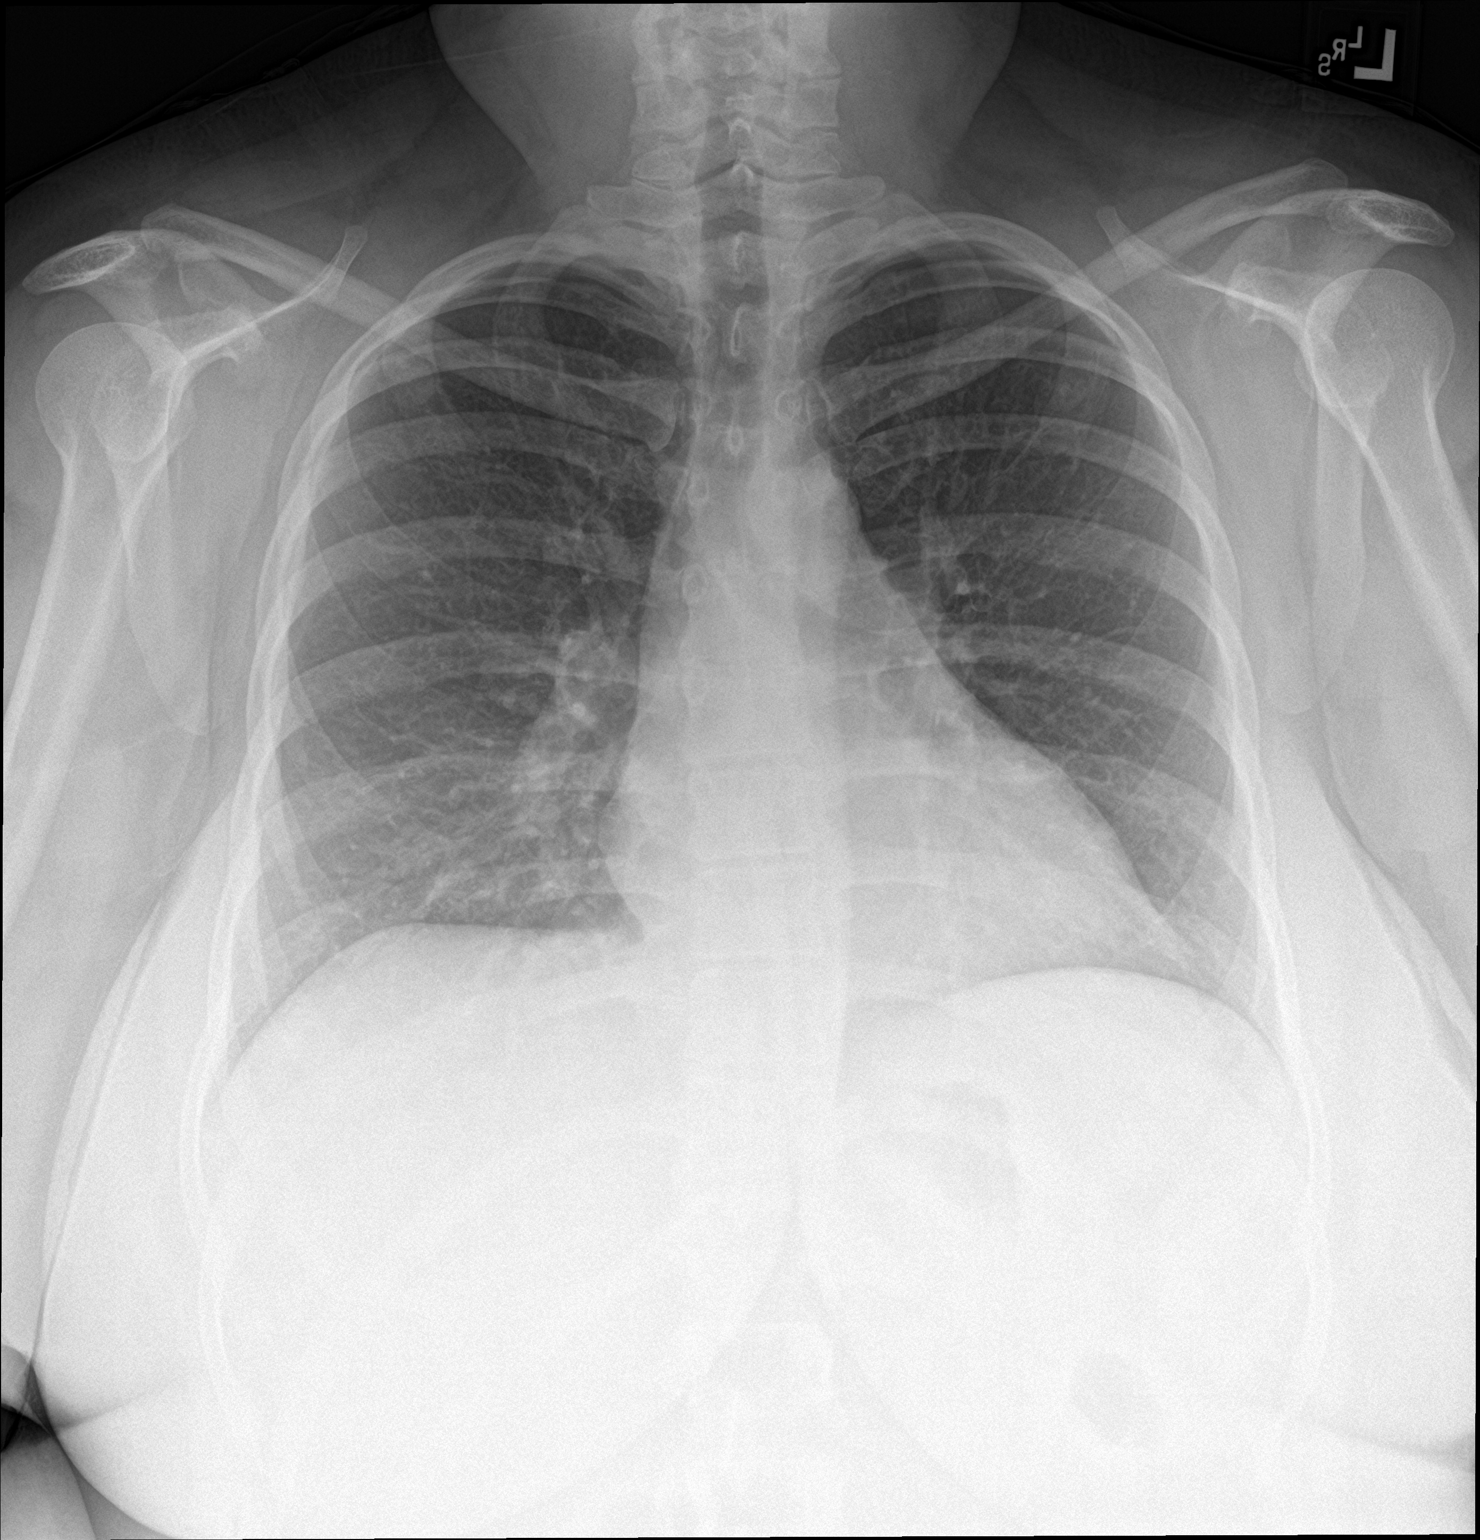

[chest lat]
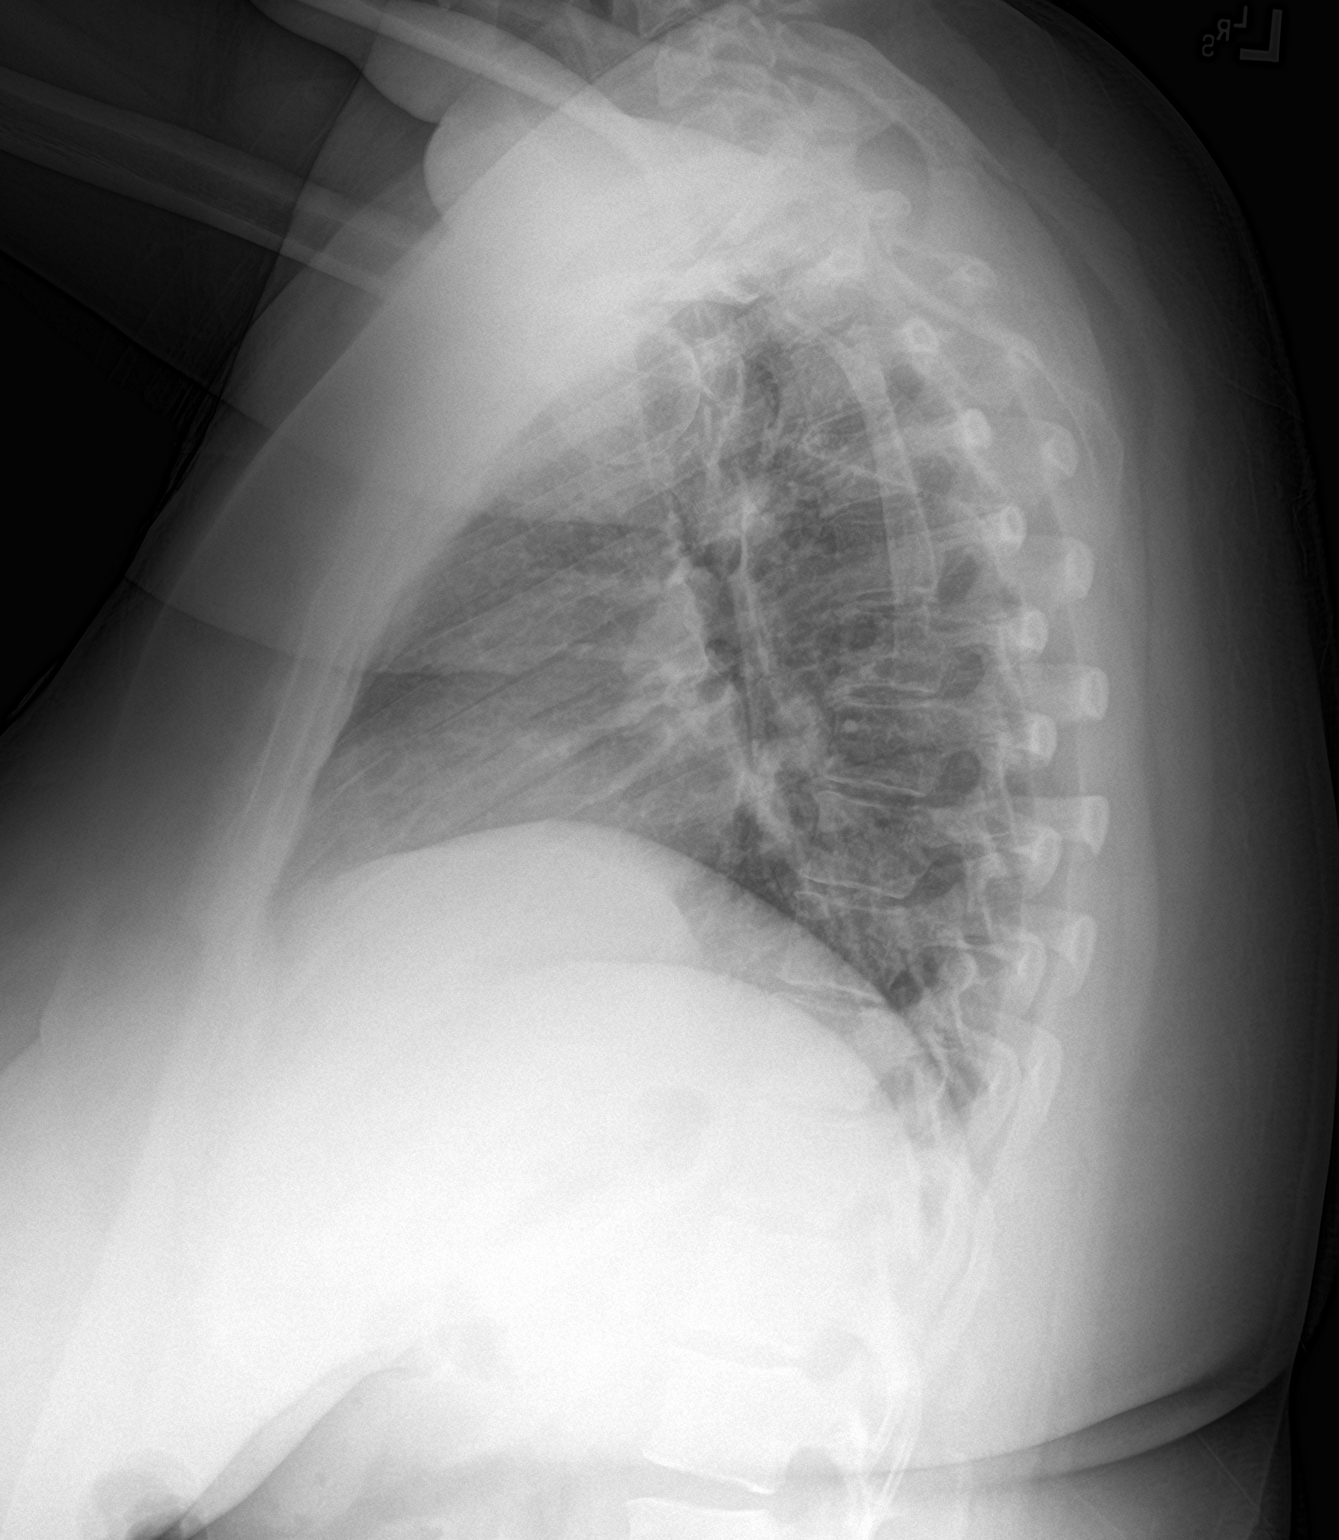

[2 of 2 positions shown; findings below may reference images not displayed]

FINDINGS: The lungs are well-aerated and clear. There is no evidence of focal
opacification, pleural effusion or pneumothorax.

The heart is normal in size; the mediastinal contour is within
normal limits. No acute osseous abnormalities are seen.
IMPRESSION: No acute cardiopulmonary process seen.

## 2018-08-23 LAB — HM PAP SMEAR

## 2018-08-31 ENCOUNTER — Ambulatory Visit: Payer: Self-pay | Admitting: *Deleted

## 2018-08-31 NOTE — Telephone Encounter (Signed)
Pt states she has been experiencing blurred vision that comes and goes. Pt states she is not really sure if the blurry vision is in both eyes. Pt states she can still see but in certain spots she sees blurred lines that move. Pt denies any blurry vision at this time. Pt states it initially occurred twice last week, yesterday and today in the morning. Pt states she has a mild headache but no other symptoms. Pt states she does wear glasses but she has not contacted optometrist for current symptoms. Pt requesting to come in for appt on tomorrow. Pt scheduled with Dr. Jimmey Ralph on 09/01/18. Pt advised to return call to office with worsening symptoms. Understanding verbalized.  Reason for Disposition . [1] Brief (now gone) blurred vision AND [2] unexplained  Answer Assessment - Initial Assessment Questions 1. DESCRIPTION: "What is the vision loss like? Describe it for me." (e.g., complete vision loss, blurred vision, double vision, floaters, etc.)     Blurry vision 2. LOCATION: "One or both eyes?" If one, ask: "Which eye?"     Both eyes 3. SEVERITY: "Can you see anything?" If so, ask: "What can you see?" (e.g., fine print)     Can see some things, more like blurred lines that move 4. ONSET: "When did this begin?" "Did it start suddenly or has this been gradual?"     Last week and started Monday and Tuesday evening and yesterday and today in the morning 5. PATTERN: "Does this come and go, or has it been constant since it started?"     Comes and goes and only last for approximately 1/2 hour 6. PAIN: "Is there any pain in your eye(s)?"  (Scale 1-10; or mild, moderate, severe)     No 7. CONTACTS-GLASSES: "Do you wear contacts or glasses?"     glasses 8. CAUSE: "What do you think is causing this visual problem?"     unknown 9. OTHER SYMPTOMS: "Do you have any other symptoms?" (e.g., confusion, headache, arm or leg weakness, speech problems)      Mild headache 10. PREGNANCY: "Is there any chance you are  pregnant?" "When was your last menstrual period?"       No I don't think so I just had a baby and cylce just started the other day  Protocols used: VISION LOSS OR CHANGE-A-AH

## 2018-09-01 ENCOUNTER — Ambulatory Visit (INDEPENDENT_AMBULATORY_CARE_PROVIDER_SITE_OTHER): Payer: Managed Care, Other (non HMO) | Admitting: Family Medicine

## 2018-09-01 ENCOUNTER — Encounter: Payer: Self-pay | Admitting: Family Medicine

## 2018-09-01 VITALS — BP 128/78 | HR 81 | Temp 98.2°F | Ht 61.0 in | Wt 213.8 lb

## 2018-09-01 DIAGNOSIS — H539 Unspecified visual disturbance: Secondary | ICD-10-CM | POA: Diagnosis not present

## 2018-09-01 DIAGNOSIS — R51 Headache: Secondary | ICD-10-CM

## 2018-09-01 DIAGNOSIS — R519 Headache, unspecified: Secondary | ICD-10-CM

## 2018-09-01 NOTE — Patient Instructions (Signed)
It was very nice to see you today!  Please follow up with your eye doctor.  Please let us know if they do not find any abnormalities.  Please let us know if you have severe headaches or development of any other symptoms.  Take care, Dr Jimmey Ralph

## 2018-09-01 NOTE — Progress Notes (Signed)
   Chief Complaint:  Jessica Hooper is a 33 y.o. female who presents for same day appointment with a chief complaint of blurred vision.   Assessment/Plan:  Vision changes No red flags.  It is possible that her symptoms could be secondary to eyestrain.  Recommended that she follow-up with her eye doctor soon to have this evaluated.  It is also possible that she could be having migraine with aura.  She has a normal neurological exam and her visual acuity is normal -do not think she needs urgent or emergent evaluation at this time.  If symptoms persist and her eye exam is normal, would consider referral to neurology and/or brain imaging.  Discussed reasons to return to care and seek emergent care.  Follow-up as needed.    Subjective:  HPI:  Blurred Vision, acute problem Started about a week ago. Stable over that time.  Patient describes a few episodes in which she had " squiggly lines" in her vision that lasts for about 30 minutes and then subsides.  These vision changes occurred bilaterally.  Symptoms occur and resolve spontaneously.  No obvious alleviating or aggravating factors.  Shortly after the phenomenon resolves she will have a mild headache that can last for a few hours.  She has not had any other symptoms.  No weakness or numbness.  No speech difficulties.  Headache is described as mild and located in bilateral frontal area.  She is concerned that it could be blood pressure related.  She has not had a recent eye exam. No other obvious alleviating or aggravating factors.  She does not have any current symptoms.  ROS: Per HPI  PMH: She reports that she has never smoked. She has never used smokeless tobacco. She reports previous alcohol use. She reports that she does not use drugs.      Objective:  Physical Exam: BP 128/78 (BP Location: Left Arm, Patient Position: Sitting, Cuff Size: Normal)   Pulse 81   Temp 98.2 F (36.8 C) (Oral)   Ht 5\' 1"  (1.549 m)   Wt 213 lb 12.8 oz (97 kg)    LMP 10/28/2017   SpO2 98%   BMI 40.40 kg/m   Gen: NAD, resting comfortably HEENT: TMs clear.  Extraocular eye movements intact without pain. CV: Regular rate and rhythm with no murmurs appreciated Pulm: Normal work of breathing, clear to auscultation bilaterally with no crackles, wheezes, or rhonchi Neuro: Visual acuity intact bilaterally.  Cranial nerves II through XII intact.  Finger-nose-finger testing intact bilaterally.  Strength 5 out of 5 in upper and lower extremities.  Sensation light touch intact throughout.     Time Spent: I spent >15 minutes face-to-face with the patient, with more than half spent on counseling for management plan for her headaches and vision changes.   Katina Degree. Jimmey Ralph, MD 09/01/2018 10:34 AM

## 2018-09-03 ENCOUNTER — Encounter: Payer: Self-pay | Admitting: Family Medicine

## 2018-09-29 ENCOUNTER — Other Ambulatory Visit (HOSPITAL_COMMUNITY): Payer: Self-pay | Admitting: Certified Nurse Midwife

## 2018-10-26 ENCOUNTER — Encounter: Payer: Self-pay | Admitting: Family Medicine

## 2018-11-10 ENCOUNTER — Encounter: Payer: Self-pay | Admitting: Physician Assistant

## 2018-11-10 ENCOUNTER — Ambulatory Visit (INDEPENDENT_AMBULATORY_CARE_PROVIDER_SITE_OTHER): Payer: Managed Care, Other (non HMO) | Admitting: Physician Assistant

## 2018-11-10 DIAGNOSIS — J4520 Mild intermittent asthma, uncomplicated: Secondary | ICD-10-CM | POA: Diagnosis not present

## 2018-11-10 MED ORDER — BUDESONIDE-FORMOTEROL FUMARATE 160-4.5 MCG/ACT IN AERO: 2.0000 | INHALATION_SPRAY | Freq: Two times a day (BID) | RESPIRATORY_TRACT | 5 refills | Status: AC

## 2018-11-10 NOTE — Progress Notes (Signed)
Virtual Visit via Video   I connected with Jessica Hooper on 11/10/18 at  2:00 PM EDT by a video enabled telemedicine application and verified that I am speaking with the correct person using two identifiers. Location patient: Home Location provider: [facility], Office Persons participating in the virtual visit: Jessica Hooper, Jessica Hooper, [place]   I discussed the limitations of evaluation and management by telemedicine and the availability of in person appointments. The patient expressed understanding and agreed to proceed.  Subjective:   HPI:   Asthma Has history of asthma. Last seen by me on 10/02/17 for this. She has seen pulmonology for this, as recently as 04/01/17. She was put on Symbicort daily and Albuterol prn. She is rarely using her symbicort and albuterol. She is using them prn at this point. Denies any recent exacerbations or URI.   ROS: See pertinent positives and negatives per HPI.  Patient Active Problem List   Diagnosis Date Noted  . Preeclampsia 07/07/2018  . Abnormal glucose tolerance test (GTT) during pregnancy, antepartum 06/04/2018  . Pregnant and not yet delivered in first trimester 01/11/2018  . Thyroid disease 01/11/2018  . Back pain 01/11/2018  . GERD (gastroesophageal reflux disease) 01/11/2018  . Allergic rhinitis 04/01/2017  . Asthma, chronic 12/29/2015  . Exertional dyspnea 12/29/2015  . Hypersomnia 12/29/2015  . Status post vacuum-assisted vaginal delivery 12/12/2015  . IUGR (intrauterine growth restriction) 12/11/2015    Social History   Tobacco Use  . Smoking status: Never Smoker  . Smokeless tobacco: Never Used  Substance Use Topics  . Alcohol use: Not Currently    Comment:  socially. Approx two cups per month    Current Outpatient Medications:  .  acetaminophen (TYLENOL) 500 MG tablet, Take 1,000 mg by mouth every 6 (six) hours as needed for moderate pain. , Disp: , Rfl:  .  albuterol (PROVENTIL HFA;VENTOLIN HFA) 108 (90 Base)  MCG/ACT inhaler, Inhale 2 puffs into the lungs every 6 (six) hours as needed for wheezing or shortness of breath., Disp: 1 Inhaler, Rfl: 6 .  budesonide-formoterol (SYMBICORT) 160-4.5 MCG/ACT inhaler, Inhale 2 puffs into the lungs 2 (two) times daily., Disp: 1 Inhaler, Rfl: 5 .  JUNEL FE 1/20 1-20 MG-MCG tablet, Take 1 tablet by mouth daily., Disp: , Rfl:  .  mupirocin ointment (BACTROBAN) 2 %, APPLY TO AFFECTED AREA TWICE A DAY, Disp: , Rfl:  .  ondansetron (ZOFRAN-ODT) 8 MG disintegrating tablet, Take 8 mg by mouth as needed for nausea or vomiting. , Disp: , Rfl:   No Known Allergies  Objective:   VITALS: Per patient if applicable, see vitals. GENERAL: Alert, appears well and in no acute distress. HEENT: Atraumatic, conjunctiva clear, no obvious abnormalities on inspection of external nose and ears. NECK: Normal movements of the head and neck. CARDIOPULMONARY: No increased WOB. Speaking in clear sentences. I:E ratio WNL.  MS: Moves all visible extremities without noticeable abnormality. PSYCH: Pleasant and cooperative, well-groomed. Speech normal rate and rhythm. Affect is appropriate. Insight and judgement are appropriate. Attention is focused, linear, and appropriate.  NEURO: CN grossly intact. Oriented as arrived to appointment on time with no prompting. Moves both UE equally.  SKIN: No obvious lesions, wounds, erythema, or cyanosis noted on face or hands.  Assessment and Plan:   Jessica Hooper was seen today for asthma.  Diagnoses and all orders for this visit:  Mild intermittent chronic asthma without complication  Other orders -     budesonide-formoterol (SYMBICORT) 160-4.5 MCG/ACT inhaler; Inhale 2 puffs  into the lungs 2 (two) times daily.   Overall well controlled presently. Discussed that symbicort is designed to be used regularly. She will consider this. Will refill symbicort at this time. Follow-up if any concerns, otherwise will do routine f/u in 1 year.  . Reviewed  expectations re: course of current medical issues. . Discussed self-management of symptoms. . Outlined signs and symptoms indicating need for more acute intervention. . Patient verbalized understanding and all questions were answered. Marland Kitchen. Health Maintenance issues including appropriate healthy diet, exercise, and smoking avoidance were discussed with patient. . See orders for this visit as documented in the electronic medical record.  I discussed the assessment and treatment plan with the patient. The patient was provided an opportunity to ask questions and all were answered. The patient agreed with the plan and demonstrated an understanding of the instructions.   The patient was advised to call back or seek an in-person evaluation if the symptoms worsen or if the condition fails to improve as anticipated.       Jessica Hooper, [place] 11/10/2018

## 2018-11-13 ENCOUNTER — Encounter: Payer: Self-pay | Admitting: Physician Assistant

## 2018-11-18 ENCOUNTER — Encounter: Payer: Self-pay | Admitting: Physician Assistant

## 2018-11-24 ENCOUNTER — Encounter: Payer: Self-pay | Admitting: Physician Assistant

## 2019-02-08 ENCOUNTER — Encounter: Payer: Self-pay | Admitting: Physician Assistant
